# Patient Record
Sex: Female | Born: 1940 | Race: White | Hispanic: No | Marital: Married | Smoking: Never smoker
Health system: Southern US, Community
[De-identification: ages and names within clinical notes are randomized; demographics above are authoritative.]

## PROBLEM LIST (undated history)

## (undated) DIAGNOSIS — L439 Lichen planus, unspecified: Secondary | ICD-10-CM

## (undated) DIAGNOSIS — E785 Hyperlipidemia, unspecified: Secondary | ICD-10-CM

## (undated) DIAGNOSIS — K2970 Gastritis, unspecified, without bleeding: Secondary | ICD-10-CM

## (undated) DIAGNOSIS — F329 Major depressive disorder, single episode, unspecified: Secondary | ICD-10-CM

## (undated) DIAGNOSIS — R0789 Other chest pain: Secondary | ICD-10-CM

## (undated) DIAGNOSIS — I10 Essential (primary) hypertension: Secondary | ICD-10-CM

## (undated) DIAGNOSIS — M1990 Unspecified osteoarthritis, unspecified site: Secondary | ICD-10-CM

## (undated) DIAGNOSIS — Z862 Personal history of diseases of the blood and blood-forming organs and certain disorders involving the immune mechanism: Secondary | ICD-10-CM

## (undated) DIAGNOSIS — E119 Type 2 diabetes mellitus without complications: Secondary | ICD-10-CM

## (undated) DIAGNOSIS — F32A Depression, unspecified: Secondary | ICD-10-CM

## (undated) DIAGNOSIS — R0600 Dyspnea, unspecified: Secondary | ICD-10-CM

## (undated) DIAGNOSIS — R5383 Other fatigue: Secondary | ICD-10-CM

## (undated) DIAGNOSIS — Z8709 Personal history of other diseases of the respiratory system: Secondary | ICD-10-CM

## (undated) DIAGNOSIS — K589 Irritable bowel syndrome without diarrhea: Secondary | ICD-10-CM

## (undated) DIAGNOSIS — F419 Anxiety disorder, unspecified: Secondary | ICD-10-CM

## (undated) DIAGNOSIS — K5790 Diverticulosis of intestine, part unspecified, without perforation or abscess without bleeding: Secondary | ICD-10-CM

## (undated) HISTORY — DX: Depression, unspecified: F32.A

## (undated) HISTORY — DX: Personal history of diseases of the blood and blood-forming organs and certain disorders involving the immune mechanism: Z86.2

## (undated) HISTORY — PX: MOHS SURGERY: SHX181

## (undated) HISTORY — DX: Other chest pain: R07.89

## (undated) HISTORY — DX: Dyspnea, unspecified: R06.00

## (undated) HISTORY — DX: Essential (primary) hypertension: I10

## (undated) HISTORY — DX: Lichen planus, unspecified: L43.9

## (undated) HISTORY — DX: Other fatigue: R53.83

## (undated) HISTORY — DX: Diverticulosis of intestine, part unspecified, without perforation or abscess without bleeding: K57.90

## (undated) HISTORY — DX: Personal history of other diseases of the respiratory system: Z87.09

## (undated) HISTORY — DX: Irritable bowel syndrome, unspecified: K58.9

## (undated) HISTORY — DX: Unspecified osteoarthritis, unspecified site: M19.90

## (undated) HISTORY — DX: Gastritis, unspecified, without bleeding: K29.70

## (undated) HISTORY — DX: Hyperlipidemia, unspecified: E78.5

## (undated) HISTORY — PX: ABDOMINAL HYSTERECTOMY: SHX81

## (undated) HISTORY — PX: OTHER SURGICAL HISTORY: SHX169

## (undated) HISTORY — PX: APPENDECTOMY: SHX54

## (undated) HISTORY — PX: CATARACT EXTRACTION, BILATERAL: SHX1313

## (undated) HISTORY — PX: CHOLECYSTECTOMY: SHX55

---
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
## 2004-02-25 ENCOUNTER — Other Ambulatory Visit: Admission: RE | Admit: 2004-02-25 | Discharge: 2004-02-25 | Payer: Self-pay | Admitting: Obstetrics and Gynecology

## 2006-01-19 ENCOUNTER — Ambulatory Visit (HOSPITAL_BASED_OUTPATIENT_CLINIC_OR_DEPARTMENT_OTHER): Admission: RE | Admit: 2006-01-19 | Discharge: 2006-01-19 | Payer: Self-pay | Admitting: Orthopedic Surgery

## 2009-06-08 ENCOUNTER — Ambulatory Visit: Payer: Self-pay | Admitting: Cardiovascular Disease

## 2009-06-14 ENCOUNTER — Telehealth: Payer: Self-pay | Admitting: Cardiovascular Disease

## 2009-06-22 ENCOUNTER — Ambulatory Visit: Payer: Self-pay | Admitting: Cardiovascular Disease

## 2009-08-30 ENCOUNTER — Encounter: Admission: RE | Admit: 2009-08-30 | Discharge: 2009-08-30 | Payer: Self-pay | Admitting: Gastroenterology

## 2010-11-05 ENCOUNTER — Encounter: Payer: Self-pay | Admitting: Cardiovascular Disease

## 2010-12-06 NOTE — Assessment & Plan Note (Signed)
[facility]                        [place] CARDIOLOGY OFFICE NOTE   Brittney, [name]                      MRN:          [id number]  DATE:06/22/2009                            DOB:          Oct 04, 1940    PROBLEM LIST:  1. Hypertension.  2. Hyperlipidemia.  3. Recent onset of chest discomfort and increased fatigue and dyspnea.   INTERVAL HISTORY:  At the patient's initial visit, we had recommend a  left heart catheterization for the symptoms she was experiencing;  however, she declined that invasive approach.  A nuclear stress test  since that time showed some attenuation in the inferior wall and  anterior wall that was likely due to breast attenuation artifact and  diaphragmatic attenuation.  Wall motion was normal and there were  thought to be no perfusion defects or stress-induced ischemia.  The  patient states that since that time she has continued to have a  nocturnal chest discomfort radiating to her left arm.  She now states  that it is occasionally also radiates down her spine.  There are no  associated symptoms and it only occurs at night.  Sometimes she will  fall asleep at around 2:00 a.m. and it is still present the next  morning.  It is occurring approximately 2 times per week.  She also  endorses increased dyspnea only with exertion and increased fatigue.  She continues to decline an invasive approach and in meanwhile we can  seen an invasive diagnostic approach.   PHYSICAL EXAMINATION:  VITAL SIGNS:  Her blood pressure is 126/75, pulse  is 89, saturating 95% on room air.  She weighs 163 pounds.  GENERAL:  She is in no acute distress.  HEENT:  Nonfocal.  HEART:  Regular rate and rhythm without murmur, rub or gallop.  LUNGS:  Clear bilaterally.  ABDOMEN:  Soft, nontender, nondistended.  She does have some minor  tenderness to palpation along the left lower rib cage area.  EXTREMITIES:  Trace bilateral lower extremity edema.   ASSESSMENT AND PLAN:  The patient continues to be having symptoms that  are worrisome for her angina.  Her stress test is not a high-risk study  so medical approach to treatment is reasonable at this time.  She has  been taking her aspirin and Coreg intermittently and we asked that she  began to take them regularly.  Specifically, she should take aspirin  daily and Coreg 6.25 mg twice a day.  We also asked that she takes a  nitroglycerin she has at home while she is having the chest discomfort  to see if it helps relieve the symptoms.  She will contact our office in  the next week or two if the symptoms are not relieved with this  approach.  One option if she  continues to decline a heart catheterization is to perform a CT  angiogram of her coronary arteries.     Brayton El, MD  Electronically Signed    SGA/MedQ  DD: 06/22/2009  DT: 06/23/2009  Job #: [id number]   cc:   Dr. Brent Bulla

## 2010-12-06 NOTE — Assessment & Plan Note (Signed)
[facility]                        [place] CARDIOLOGY OFFICE NOTE   Brittney, [name]                      MRN:          [id number]  DATE:06/08/2009                            DOB:          05-Jan-1941    CHIEF COMPLAINT:  Chest pain.   [name] is a 70 year old white female with past medical history  significant for hyperlipidemia and a family history of premature  coronary disease, who is presenting with new chest discomfort for the  past 2 months.  The patient states for the past two months she has had  intermittent episodes of chest discomfort that consistently radiates to  her left arm.  It is not associated with activity and does not have any  associated symptoms.  It occurs several times a week, but seems to be  worse at night.  Each episode lasts an hour or two before resolving on  its own.  Of note, she was recently placed on Coreg by her primary care  physician and she states that the episodes are less intense since she  was given this medication.  The patient also endorses increased dyspnea  on exertion over the past several months.   PAST MEDICAL HISTORY:  1. Hyperlipidemia.  2. Borderline blood pressure per the patient.  3. She is status post cholecystectomy.  4. Status post hysterectomy.  5. History of colon polyps.  6. Diverticulosis.   SOCIAL HISTORY:  No tobacco, no alcohol.  She is retired and lives with  her husband.   FAMILY HISTORY:  Positive for premature coronary disease in most of her  brothers and sisters.   ALLERGIES:  THE PATIENT STATES MORPHINE AND CODEINE CAUSES NAUSEA.   MEDICATIONS:  1. Aspirin 81 mg daily.  2. Carvedilol 6.25 mg daily.  This may be the extended-release form.  3. Lovastatin 20 mg daily.  4. Levothyroxine 75 mcg daily.  5. Multivitamin.  6. Coenzyme Q10.  7. Biotin.  8. Vitamin D.  9. Calcium.  10.Magnesium.  11.Zinc.  12.B12.   REVIEW OF SYSTEMS:  As in HPI.  All other systems  were reviewed and are  negative.   PHYSICAL EXAMINATION:  VITAL SIGNS:  Blood pressure is 116/75, pulse 83.  Saturating 95% room air.  She weighs 164 pounds.  GENERAL:  She is in no acute distress.  HEENT:  Normocephalic, atraumatic.  Nonfocal.  NECK:  Supple.  There is no JVD.  There are no carotid bruits.  HEART:  Regular rate and rhythm without murmur, rub or gallop.  LUNGS:  Clear bilaterally.  ABDOMEN:  Soft, nontender, nondistended.  EXTREMITIES:  Without edema.  SKIN:  Warm and dry.  NEURO:  Nonfocal.  MUSCULOSKELETAL:  The patient has 5/5 bilateral upper and lower  extremity strength.  PSYCHIATRIC:  The patient is appropriate with normal levels of insight.   LABORATORY DATA:  Dated June 03, 2009, sodium 142, potassium 5.2,  chloride 104, CO2 of 21, BUN 11, creatinine 0.79, glucose 110, white  count 5.8, hemoglobin 14, hematocrit 42, platelet count 185.  LFTs are  within normal limits.  She had a total cholesterol  of 152, triglycerides  86, HDL 47, LDL 86, TSH is 2.98.  EKG taken today in the office  independently reviewed by myself demonstrates normal sinus rhythm  without any ST or T-wave abnormalities.   ASSESSMENT:  A 70 year old white female with hyperlipidemia and a family  history of premature coronary disease presenting with symptoms that are  concerning for angina.  The patient's symptoms have been slightly  improved with the addition of beta blockade to her medical regimen,  however, they are still occurring.   PLAN:  I recommended a left heart catheterization but the patient  declines the procedure at this time.  In it's place we have ordered a  nuclear stress test.  She should continue on the ASA and coreg and call  [phone] if the chest discomfort does not resolve or worsens.     Brayton El, MD  Electronically Signed    SGA/MedQ  DD: 06/08/2009  DT: 06/08/2009  Job #: [id number]

## 2010-12-06 NOTE — Letter (Signed)
June 08, 2009    Dr. Brent Bulla  [address]  [place], [place]  [address]   RE:  [name], [name]  MRN:  [id number]  /  DOB:  1940-08-01   Dear Dr. Marina Goodell:   I had the pleasure of seeing your patient, Ms. [name], in the Cardiology  Clinic today.  As you know, she has been experiencing chest discomfort  radiating to her left arm intermittently for the past 2 months.  These  symptoms combined with the fact that she has a family history of  premature coronary disease and a history of hyperlipidemia clearly  increase the potential for obstructive coronary artery disease.  I have  recommended a left heart catheterization for Ms. [name] to evaluate  this.  However at this time, she is declining the procedure secondary to  concerns regarding its invasive nature.  Instead, we have opted for a  [employer] which she will have.  If her stress test is  positive, I would again recommend left heart catheterization.  If her  stress test is negative, I would most likely observe her for a period of  time to see if the chest pain resolves.  If it does not resolve, I would  probably still recommend an invasive evaluation.  I will keep you  updated as the process unfolds.    Sincerely,      Brayton El, MD  Electronically Signed    SGA/MedQ  DD: 06/08/2009  DT: 06/08/2009  Job #: [id number]

## 2010-12-09 NOTE — Op Note (Signed)
NAMESOLANGE, [name]               ACCOUNT NO.:  [id number]   MEDICAL RECORD NO.:  [id number]          PATIENT TYPE:  AMB   LOCATION:  DSC                          FACILITY:  [facility]   PHYSICIAN:  Rodney A. Mortenson, M.D.DATE OF BIRTH:  May 03, 1941   DATE OF PROCEDURE:  01/19/2006  DATE OF DISCHARGE:                                 OPERATIVE REPORT   PREOPERATIVE DIAGNOSIS:  Tear of posterior horn of medial meniscus, left  knee.   POSTOPERATIVE DIAGNOSES:  Tear of posterior horn of medial meniscus, left  knee, fraying of leading edge of the lateral meniscus, advanced  chondromalacia of lateral patellar facet, left knee.   OPERATION:  Partial medial and lateral meniscectomy, chondroplasty of  lateral facet, left knee.   SURGEON:  Lenard Galloway. Chaney Malling, M.D.   ANESTHESIA:  MAC.   PATHOLOGY:  Using the arthroscope a very careful examination of the knee was  undertaken.  The patellofemoral joint was visualized first.  There was a  fair amount of fraying and tearing about the articular cartilage and lateral  patellar facet.  The femoral notch area, however, appeared fairly normal.  The anterior cruciate ligament was normal.  In the lateral compartment there  was normal articular cartilage of the lateral femoral condyle and lateral  tibial plateau, but there was fraying of the leading edge of the lateral  meniscus.  The anterior cruciate ligament was normal.  In the medial  compartment there was a complex tear of the posterior horn of the medial  meniscus and there was some damage to the weightbearing area of the medial  femoral condyle articular cartilage.   DESCRIPTION OF PROCEDURE:  The patient was placed on the operating room  table in the supine position. With the pneumatic tourniquet about the left  upper thigh, the entire left lower extremity was prepped with DuraPrep and  draped out in the usual manner.  Marcaine was placed in the knee and  Xylocaine and epinephrine used to  infiltrate the puncture wounds.  An  infusion cannula was placed in the paramedial pouch and the knee distended  with saline.  The anterior medial and anterior lateral portals were made and  the arthroscope was introduced.  Attention was first turned to the patella.  The lateral patellar facet was debrided with a full radius resector.  This  was debrided down to fairly smooth and stable cartilage.  This cleaned up  very nicely.  The arthroscope was then plated in the lateral compartment.  There was fraying of the leading edge of the lateral meniscus and this area  was debrided with a chondroplasty shaver.  Attention was then turned to the  medial compartment.  There was a complicated tear of the posterior horn of  the medial meniscus and this was debrided through the medial and lateral  portals and a midline portal.  Once this was completed, an intra-articular  shaver was introduced.  All debris was removed.  The remaining rim was then  smoothed and balanced with a nice transition of the medial meniscus.  No  other significant pathology was seen in  the knee.  Marcaine was placed in  the knee and a large bulky pressure dressing was applied.   The patient returned to the recovery room in excellent condition.  Technically this procedure went extremely well.   PLAN:  1.  Regular follow-up care.  To be seen in my office next week.  2.  Macrodantin Fortis for pain.  3.  Home instructions given to the family.           ______________________________  Lenard Galloway Chaney Malling, M.D.     RAM/MEDQ  D:  01/19/2006  T:  01/19/2006  Job:  [id number]

## 2011-02-03 ENCOUNTER — Encounter: Payer: Self-pay | Admitting: Cardiovascular Disease

## 2011-05-10 HISTORY — PX: ESOPHAGOGASTRODUODENOSCOPY: SHX1529

## 2014-10-15 ENCOUNTER — Other Ambulatory Visit: Payer: Self-pay | Admitting: Gastroenterology

## 2014-10-15 DIAGNOSIS — Z1211 Encounter for screening for malignant neoplasm of colon: Secondary | ICD-10-CM

## 2014-10-29 ENCOUNTER — Ambulatory Visit
Admission: RE | Admit: 2014-10-29 | Discharge: 2014-10-29 | Disposition: A | Discharge status: Home / Self Care | Payer: Medicare Other | Source: Ambulatory Visit | Attending: Gastroenterology | Admitting: Gastroenterology

## 2014-10-29 DIAGNOSIS — Z1211 Encounter for screening for malignant neoplasm of colon: Secondary | ICD-10-CM

## 2014-10-29 HISTORY — PX: OTHER SURGICAL HISTORY: SHX169

## 2016-07-07 DIAGNOSIS — Z6827 Body mass index (BMI) 27.0-27.9, adult: Secondary | ICD-10-CM | POA: Diagnosis not present

## 2016-07-07 DIAGNOSIS — J019 Acute sinusitis, unspecified: Secondary | ICD-10-CM | POA: Diagnosis not present

## 2016-07-11 DIAGNOSIS — E039 Hypothyroidism, unspecified: Secondary | ICD-10-CM | POA: Diagnosis not present

## 2016-07-11 DIAGNOSIS — E559 Vitamin D deficiency, unspecified: Secondary | ICD-10-CM | POA: Diagnosis not present

## 2016-07-11 DIAGNOSIS — R5382 Chronic fatigue, unspecified: Secondary | ICD-10-CM | POA: Diagnosis not present

## 2016-07-13 DIAGNOSIS — L430 Hypertrophic lichen planus: Secondary | ICD-10-CM | POA: Diagnosis not present

## 2016-07-13 DIAGNOSIS — C44311 Basal cell carcinoma of skin of nose: Secondary | ICD-10-CM | POA: Diagnosis not present

## 2016-07-13 DIAGNOSIS — L603 Nail dystrophy: Secondary | ICD-10-CM | POA: Diagnosis not present

## 2016-07-13 DIAGNOSIS — D485 Neoplasm of uncertain behavior of skin: Secondary | ICD-10-CM | POA: Diagnosis not present

## 2016-07-20 DIAGNOSIS — L439 Lichen planus, unspecified: Secondary | ICD-10-CM | POA: Diagnosis not present

## 2016-07-20 DIAGNOSIS — E119 Type 2 diabetes mellitus without complications: Secondary | ICD-10-CM | POA: Diagnosis not present

## 2016-07-20 DIAGNOSIS — R5383 Other fatigue: Secondary | ICD-10-CM | POA: Diagnosis not present

## 2016-07-20 DIAGNOSIS — E039 Hypothyroidism, unspecified: Secondary | ICD-10-CM | POA: Diagnosis not present

## 2016-07-20 DIAGNOSIS — E785 Hyperlipidemia, unspecified: Secondary | ICD-10-CM | POA: Diagnosis not present

## 2016-07-20 DIAGNOSIS — K219 Gastro-esophageal reflux disease without esophagitis: Secondary | ICD-10-CM | POA: Diagnosis not present

## 2016-07-20 DIAGNOSIS — R42 Dizziness and giddiness: Secondary | ICD-10-CM | POA: Diagnosis not present

## 2016-07-20 DIAGNOSIS — Z6827 Body mass index (BMI) 27.0-27.9, adult: Secondary | ICD-10-CM | POA: Diagnosis not present

## 2016-08-08 DIAGNOSIS — R928 Other abnormal and inconclusive findings on diagnostic imaging of breast: Secondary | ICD-10-CM | POA: Diagnosis not present

## 2016-08-08 DIAGNOSIS — R921 Mammographic calcification found on diagnostic imaging of breast: Secondary | ICD-10-CM | POA: Diagnosis not present

## 2016-08-08 DIAGNOSIS — Z09 Encounter for follow-up examination after completed treatment for conditions other than malignant neoplasm: Secondary | ICD-10-CM | POA: Diagnosis not present

## 2016-09-20 DIAGNOSIS — C44319 Basal cell carcinoma of skin of other parts of face: Secondary | ICD-10-CM | POA: Diagnosis not present

## 2016-10-13 DIAGNOSIS — Z961 Presence of intraocular lens: Secondary | ICD-10-CM | POA: Diagnosis not present

## 2016-10-13 DIAGNOSIS — H524 Presbyopia: Secondary | ICD-10-CM | POA: Diagnosis not present

## 2016-10-13 DIAGNOSIS — H04123 Dry eye syndrome of bilateral lacrimal glands: Secondary | ICD-10-CM | POA: Diagnosis not present

## 2016-10-13 DIAGNOSIS — H31002 Unspecified chorioretinal scars, left eye: Secondary | ICD-10-CM | POA: Diagnosis not present

## 2016-12-05 DIAGNOSIS — R42 Dizziness and giddiness: Secondary | ICD-10-CM | POA: Diagnosis not present

## 2016-12-05 DIAGNOSIS — E039 Hypothyroidism, unspecified: Secondary | ICD-10-CM | POA: Diagnosis not present

## 2016-12-05 DIAGNOSIS — R5383 Other fatigue: Secondary | ICD-10-CM | POA: Diagnosis not present

## 2016-12-05 DIAGNOSIS — K219 Gastro-esophageal reflux disease without esophagitis: Secondary | ICD-10-CM | POA: Diagnosis not present

## 2016-12-27 DIAGNOSIS — G7000 Myasthenia gravis without (acute) exacerbation: Secondary | ICD-10-CM | POA: Diagnosis not present

## 2016-12-27 DIAGNOSIS — R682 Dry mouth, unspecified: Secondary | ICD-10-CM | POA: Diagnosis not present

## 2016-12-27 DIAGNOSIS — R0609 Other forms of dyspnea: Secondary | ICD-10-CM | POA: Diagnosis not present

## 2016-12-27 DIAGNOSIS — M6281 Muscle weakness (generalized): Secondary | ICD-10-CM | POA: Diagnosis not present

## 2016-12-27 DIAGNOSIS — R5383 Other fatigue: Secondary | ICD-10-CM | POA: Diagnosis not present

## 2017-04-04 DIAGNOSIS — R5383 Other fatigue: Secondary | ICD-10-CM | POA: Diagnosis not present

## 2017-04-04 DIAGNOSIS — E119 Type 2 diabetes mellitus without complications: Secondary | ICD-10-CM | POA: Diagnosis not present

## 2017-04-04 DIAGNOSIS — E039 Hypothyroidism, unspecified: Secondary | ICD-10-CM | POA: Diagnosis not present

## 2017-04-04 DIAGNOSIS — R42 Dizziness and giddiness: Secondary | ICD-10-CM | POA: Diagnosis not present

## 2017-04-04 DIAGNOSIS — E559 Vitamin D deficiency, unspecified: Secondary | ICD-10-CM | POA: Diagnosis not present

## 2017-04-04 DIAGNOSIS — K219 Gastro-esophageal reflux disease without esophagitis: Secondary | ICD-10-CM | POA: Diagnosis not present

## 2017-04-04 DIAGNOSIS — E538 Deficiency of other specified B group vitamins: Secondary | ICD-10-CM | POA: Diagnosis not present

## 2017-06-28 DIAGNOSIS — J209 Acute bronchitis, unspecified: Secondary | ICD-10-CM | POA: Diagnosis not present

## 2017-08-15 DIAGNOSIS — K219 Gastro-esophageal reflux disease without esophagitis: Secondary | ICD-10-CM | POA: Diagnosis not present

## 2017-08-15 DIAGNOSIS — E119 Type 2 diabetes mellitus without complications: Secondary | ICD-10-CM | POA: Diagnosis not present

## 2017-08-15 DIAGNOSIS — E785 Hyperlipidemia, unspecified: Secondary | ICD-10-CM | POA: Diagnosis not present

## 2017-08-15 DIAGNOSIS — E039 Hypothyroidism, unspecified: Secondary | ICD-10-CM | POA: Diagnosis not present

## 2017-09-13 DIAGNOSIS — J209 Acute bronchitis, unspecified: Secondary | ICD-10-CM | POA: Diagnosis not present

## 2017-09-13 DIAGNOSIS — J019 Acute sinusitis, unspecified: Secondary | ICD-10-CM | POA: Diagnosis not present

## 2017-10-18 DIAGNOSIS — H31002 Unspecified chorioretinal scars, left eye: Secondary | ICD-10-CM | POA: Diagnosis not present

## 2017-10-18 DIAGNOSIS — H04123 Dry eye syndrome of bilateral lacrimal glands: Secondary | ICD-10-CM | POA: Diagnosis not present

## 2017-10-18 DIAGNOSIS — H52203 Unspecified astigmatism, bilateral: Secondary | ICD-10-CM | POA: Diagnosis not present

## 2017-10-18 DIAGNOSIS — H43813 Vitreous degeneration, bilateral: Secondary | ICD-10-CM | POA: Diagnosis not present

## 2017-12-24 DIAGNOSIS — L439 Lichen planus, unspecified: Secondary | ICD-10-CM | POA: Diagnosis not present

## 2017-12-24 DIAGNOSIS — E559 Vitamin D deficiency, unspecified: Secondary | ICD-10-CM | POA: Diagnosis not present

## 2017-12-24 DIAGNOSIS — E785 Hyperlipidemia, unspecified: Secondary | ICD-10-CM | POA: Diagnosis not present

## 2017-12-24 DIAGNOSIS — E119 Type 2 diabetes mellitus without complications: Secondary | ICD-10-CM | POA: Diagnosis not present

## 2018-01-17 DIAGNOSIS — R921 Mammographic calcification found on diagnostic imaging of breast: Secondary | ICD-10-CM | POA: Diagnosis not present

## 2018-02-22 ENCOUNTER — Encounter: Payer: Self-pay | Admitting: Physician Assistant

## 2018-02-22 ENCOUNTER — Encounter (INDEPENDENT_AMBULATORY_CARE_PROVIDER_SITE_OTHER): Payer: Self-pay

## 2018-02-22 ENCOUNTER — Other Ambulatory Visit (INDEPENDENT_AMBULATORY_CARE_PROVIDER_SITE_OTHER): Payer: Medicare Other

## 2018-02-22 ENCOUNTER — Ambulatory Visit (INDEPENDENT_AMBULATORY_CARE_PROVIDER_SITE_OTHER): Payer: Medicare Other | Admitting: Physician Assistant

## 2018-02-22 VITALS — BP 114/68 | HR 74 | Ht 64.0 in | Wt 157.4 lb

## 2018-02-22 DIAGNOSIS — R1084 Generalized abdominal pain: Secondary | ICD-10-CM

## 2018-02-22 DIAGNOSIS — R110 Nausea: Secondary | ICD-10-CM | POA: Diagnosis not present

## 2018-02-22 DIAGNOSIS — R194 Change in bowel habit: Secondary | ICD-10-CM

## 2018-02-22 DIAGNOSIS — R935 Abnormal findings on diagnostic imaging of other abdominal regions, including retroperitoneum: Secondary | ICD-10-CM

## 2018-02-22 LAB — COMPREHENSIVE METABOLIC PANEL
ALT: 9 U/L (ref 0–35)
AST: 13 U/L (ref 0–37)
Albumin: 4.3 g/dL (ref 3.5–5.2)
Alkaline Phosphatase: 62 U/L (ref 39–117)
BUN: 7 mg/dL (ref 6–23)
CO2: 30 mEq/L (ref 19–32)
Calcium: 9.2 mg/dL (ref 8.4–10.5)
Chloride: 103 mEq/L (ref 96–112)
Creatinine, Ser: 0.69 mg/dL (ref 0.40–1.20)
GFR: 87.53 mL/min (ref >60.00)
Glucose, Bld: 119 mg/dL — ABNORMAL HIGH (ref 70–99)
Potassium: 3.9 mEq/L (ref 3.5–5.1)
Sodium: 140 mEq/L (ref 135–145)
Total Bilirubin: 0.6 mg/dL (ref 0.2–1.2)
Total Protein: 6.7 g/dL (ref 6.0–8.3)

## 2018-02-22 LAB — CBC WITH DIFFERENTIAL/PLATELET
Basophils Absolute: 0.1 10*3/uL (ref 0.0–0.1)
Basophils Relative: 1.2 % (ref 0.0–3.0)
Eosinophils Absolute: 0.1 10*3/uL (ref 0.0–0.7)
Eosinophils Relative: 1 % (ref 0.0–5.0)
HCT: 40.2 % (ref 36.0–46.0)
Hemoglobin: 13.5 g/dL (ref 12.0–15.0)
Lymphocytes Relative: 25.1 % (ref 12.0–46.0)
Lymphs Abs: 1.6 10*3/uL (ref 0.7–4.0)
MCHC: 33.6 g/dL (ref 30.0–36.0)
MCV: 84.3 fl (ref 78.0–100.0)
Monocytes Absolute: 0.5 10*3/uL (ref 0.1–1.0)
Monocytes Relative: 8.2 % (ref 3.0–12.0)
Neutro Abs: 4.2 10*3/uL (ref 1.4–7.7)
Neutrophils Relative %: 64.5 % (ref 43.0–77.0)
Platelets: 179 10*3/uL (ref 150.0–400.0)
RBC: 4.77 Mil/uL (ref 3.87–5.11)
RDW: 14.3 % (ref 11.5–15.5)
WBC: 6.5 10*3/uL (ref 4.0–10.5)

## 2018-02-22 LAB — LIPASE: Lipase: 15 U/L (ref 11.0–59.0)

## 2018-02-22 MED ORDER — PANTOPRAZOLE SODIUM 40 MG PO TBEC: 40.0000 mg | DELAYED_RELEASE_TABLET | Freq: Every day | ORAL | 3 refills | Status: DC

## 2018-02-22 NOTE — Patient Instructions (Signed)
We have sent the following medications to your pharmacy for you to pick up at your convenience: Pantoprazole 40 mg daily  Your provider has requested that you go to the basement level for lab work before leaving today. Press "B" on the elevator. The lab is located at the first door on the left as you exit the elevator.  You have been scheduled for a CT scan of the abdomen and pelvis at [facility] ([address]---this is in the same building as [facility]).   You are scheduled on 03-01-18 at 2:30 pm. You should arrive 15 minutes prior to your appointment time for registration. Please follow the written instructions below on the day of your exam:  WARNING: IF YOU ARE ALLERGIC TO IODINE/X-RAY DYE, PLEASE NOTIFY RADIOLOGY IMMEDIATELY AT [phone]! YOU WILL BE GIVEN A 13 HOUR PREMEDICATION PREP.  1) Do not eat or drink anything after 10:30 am (4 hours prior to your test) 2) You have been given 2 bottles of oral contrast to drink. The solution may taste better if refrigerated, but do NOT add ice or any other liquid to this solution. Shake well before drinking.    Drink 1 bottle of contrast @ 12:30 pm (2 hours prior to your exam)  Drink 1 bottle of contrast @ 1:30 pm (1 hour prior to your exam)  You may take any medications as prescribed with a small amount of water except for the following: Metformin, Glucophage, Glucovance, Avandamet, Riomet, Fortamet, Actoplus Met, Janumet, Glumetza or Metaglip. The above medications must be held the day of the exam AND 48 hours after the exam.  The purpose of you drinking the oral contrast is to aid in the visualization of your intestinal tract. The contrast solution may cause some diarrhea. Before your exam is started, you will be given a small amount of fluid to drink. Depending on your individual set of symptoms, you may also receive an intravenous injection of x-ray contrast/dye. Plan on being at [facility] for 30 minutes or  longer, depending on the type of exam you are having performed.  This test typically takes 30-45 minutes to complete.  If you have any questions regarding your exam or if you need to reschedule, you may call the CT department at [phone] between the hours of 8:00 am and 5:00 pm, Monday-Friday.  ________________________________________________________________________

## 2018-02-22 NOTE — Progress Notes (Signed)
Chief Complaint: Abdominal pain, nausea  HPI:    [name] is a 77 year old female, known to Dr. Chales Abrahams, with a past medical history as listed below, previous cholecystectomy, who presents to clinic today as referral from [place], NP, with a complaint of abdominal pain and nausea.    EGD 05/10/2011 for epigastric pain and GERD with moderate gastritis.  Treated for H. pylori.  Patient was continued on Dexilant 60 mg p.o. once daily.    04/25/2012 MRI for abnormal pancreas on CT and mid abdominal pain.  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  Consistent with a benign lesion and no further work-up was required.    10/13/2014 office visit to discuss constipation.  Also scheduled for virtual colonoscopy at that visit due to several attempts at colonoscopy which had been unsuccessful.  Patient was to changed to Colace daily and set up for MRI of the pancreas due to recent abnormal CT as well as virtual colonoscopy.  Amitiza and Linzess were discussed if the patient continued with constipation.    Virtual screening colonoscopy 10/29/2014 with multiple colonic diverticula throughout the colon, primarily concentrated in the rectosigmoid and descending colon, some circumferential mucosal thickening of the rectum and a tortuous and elongated colon.  It was thought possibly sigmoidoscopy would be used to better visualize the rectum.      Today, presents to clinic accompanied by her husband who does assist with history.  Apparently 3 weeks ago started with some generalized abdominal discomfort all around the center of her abdomen and at that time was experiencing constipation.  She tried to use 3 different types of laxatives which were not working and was chewing on Tums due to an increase in nausea around that time, she finally had something "break loose" and proceeded to have little pieces of stool and has gradually been getting back to normal  bowel movements but it seems that her abdominal pain is worse.  At one point was also in her epigastrium and radiated through to her back and now complains more in her right lower quadrant towards her hip.  Tells me the last couple of days all this has seemed to let up but she has been nauseous.  Associated symptoms include bloating "it looks like I was ready to deliver twins".  Also excessive gas and a fatigue.  Does admit to eating a lot of fresh tomatoes recently.    Denies fever, chills, weight loss, blood in her stools or symptoms that awaken her from sleep.  Past Medical History:  Diagnosis Date  . Chest discomfort   . Diverticulosis   . Dyspnea   . Fatigue   . HLD (hyperlipidemia)   . HTN (hypertension)     Past Surgical History:  Procedure Laterality Date  . CHOLECYSTECTOMY    . hysterectomy - unknown type      Current Outpatient Medications  Medication Sig Dispense Refill  . aspirin 81 MG tablet Take 81 mg by mouth daily.      . Biotin 10 MG TABS Take by mouth every other day.      . calcium carbonate 200 MG capsule Take 250 mg by mouth every other day.      . carvedilol (COREG) 6.25 MG tablet Take 6.25 mg by mouth 2 (two) times daily with a meal.      . Cholecalciferol (VITAMIN D3) 3000 UNITS TABS Take by mouth every other day.      Marland Kitchen  Coenzyme Q10 (CO Q 10 PO) Take by mouth every other day.      . cyanocobalamin 500 MCG tablet Take 500 mcg by mouth every other day.      . levothyroxine (SYNTHROID, LEVOTHROID) 75 MCG tablet Take 75 mcg by mouth daily.      Marland Kitchen loratadine (CLARITIN) 10 MG tablet Take 10 mg by mouth daily.      Marland Kitchen lovastatin (MEVACOR) 20 MG tablet Take 20 mg by mouth at bedtime.      . magnesium oxide (MAG-OX) 400 MG tablet Take 400 mg by mouth every other day.      . Multiple Vitamin (MULTIVITAMIN) tablet Take 1 tablet by mouth daily.      Marland Kitchen zinc gluconate 50 MG tablet Take 50 mg by mouth every other day.       No current facility-administered medications for  this visit.     Allergies as of 02/22/2018 - Review Complete 02/22/2018  Allergen Reaction Noted  . Codeine  11/05/2010  . Morphine and related  11/05/2010    Family History  Problem Relation Age of Onset  . Coronary artery disease Unknown     Social History   Socioeconomic History  . Marital status: Married    Spouse name: Not on file  . Number of children: 3  . Years of education: Not on file  . Highest education level: Not on file  Occupational History  . Occupation: retired  Engineer, production  . Financial resource strain: Not on file  . Food insecurity:    Worry: Not on file    Inability: Not on file  . Transportation needs:    Medical: Not on file    Non-medical: Not on file  Tobacco Use  . Smoking status: Never Smoker  . Smokeless tobacco: Never Used  Substance and Sexual Activity  . Alcohol use: No  . Drug use: No  . Sexual activity: Not on file  Lifestyle  . Physical activity:    Days per week: Not on file    Minutes per session: Not on file  . Stress: Not on file  Relationships  . Social connections:    Talks on phone: Not on file    Gets together: Not on file    Attends religious service: Not on file    Active member of club or organization: Not on file    Attends meetings of clubs or organizations: Not on file    Relationship status: Not on file  . Intimate partner violence:    Fear of current or ex partner: Not on file    Emotionally abused: Not on file    Physically abused: Not on file    Forced sexual activity: Not on file  Other Topics Concern  . Not on file  Social History Narrative  . Not on file    Review of Systems:    Constitutional: No weight loss, fever or chills Skin: No rash  Cardiovascular: No chest pain Respiratory: No SOB Gastrointestinal: See HPI and otherwise negative Genitourinary: No dysuria  Neurological: No headache, dizziness or syncope Musculoskeletal: No new muscle or joint pain Hematologic: No bleeding    Psychiatric: No history of depression or anxiety   Physical Exam:  Vital signs: BP 114/68   Pulse 74   Ht 5\' 4"  (1.626 m)   Wt 157 lb 6 oz (71.4 kg)   BMI 27.01 kg/m    Constitutional:   Pleasant Elderly Caucasian female appears to be in NAD, Well  developed, Well nourished, alert and cooperative Head:  Normocephalic and atraumatic. Eyes:   PEERL, EOMI. No icterus. Conjunctiva pink. Ears:  Normal auditory acuity. Neck:  Supple Throat: Oral cavity and pharynx without inflammation, swelling or lesion.  Respiratory: Respirations even and unlabored. Lungs clear to auscultation bilaterally.   No wheezes, crackles, or rhonchi.  Cardiovascular: Normal S1, S2. No MRG. Regular rate and rhythm. No peripheral edema, cyanosis or pallor.  Gastrointestinal:  Soft, nondistended, mild generalized ttp worse in RLQ, No rebound or guarding. Normal bowel sounds. No appreciable masses or hepatomegaly. Rectal:  Not performed.  Msk:  Symmetrical without gross deformities. Without edema, no deformity or joint abnormality.  Neurologic:  Alert and  oriented x4;  grossly normal neurologically.  Skin:   Dry and intact without significant lesions or rashes. Psychiatric: Demonstrates good judgement and reason without abnormal affect or behaviors.  No recent labs or imaging.  Assessment: 1.  Abdominal pain: Generalized, seems to move around her abdomen over the past 3 weeks, now in her right lower quadrant, history of constipation per Dr. Urban GibsonGupta's notes; consider constipation +/- gastritis (seen on EGD in the past) versus other 2.  Nausea: With above 3.  Change in bowel habits: Worsened constipation, looking back it appears patient has always had trouble with constipation 4.  Abnormal imaging of the pancreas: Originally on CT, follow-up MRI in 2013 showed this with a cyst and there was no further follow-up recommended  Plan: 1.  Scheduled patient for a CT of the abdomen pelvis with contrast 2.  Ordered CBC, CMP  and lipase 3.  Start patient on Pantoprazole 40 mg daily, 30-60 minutes before breakfast #30 with 3 refills 4.  Did tell the patient we will request the rest of her records from Dr. Urban GibsonGupta's previous office.  Did receive those and they are in HPI.  Will have my nurse call her and let her know. 5.  Patient to follow in clinic per recommendations after imaging and labs above.  Hyacinth MeekerJennifer Lemmon, PA-C [facility] 02/22/2018, 1:31 PM

## 2018-03-01 ENCOUNTER — Ambulatory Visit (INDEPENDENT_AMBULATORY_CARE_PROVIDER_SITE_OTHER)
Admission: RE | Admit: 2018-03-01 | Discharge: 2018-03-01 | Disposition: A | Discharge status: Home / Self Care | Payer: Medicare Other | Source: Ambulatory Visit | Attending: Physician Assistant | Admitting: Physician Assistant

## 2018-03-01 DIAGNOSIS — R194 Change in bowel habit: Secondary | ICD-10-CM | POA: Diagnosis not present

## 2018-03-01 DIAGNOSIS — K5730 Diverticulosis of large intestine without perforation or abscess without bleeding: Secondary | ICD-10-CM | POA: Diagnosis not present

## 2018-03-01 MED ORDER — IOPAMIDOL (ISOVUE-300) INJECTION 61%
100.0000 mL | Freq: Once | INTRAVENOUS | Status: AC | PRN
  Administered 2018-03-01: 100 mL via INTRAVENOUS

## 2018-03-05 ENCOUNTER — Other Ambulatory Visit: Payer: Self-pay

## 2018-03-05 DIAGNOSIS — K5790 Diverticulosis of intestine, part unspecified, without perforation or abscess without bleeding: Secondary | ICD-10-CM

## 2018-03-05 DIAGNOSIS — R935 Abnormal findings on diagnostic imaging of other abdominal regions, including retroperitoneum: Secondary | ICD-10-CM

## 2018-03-05 DIAGNOSIS — R1084 Generalized abdominal pain: Secondary | ICD-10-CM

## 2018-03-18 ENCOUNTER — Ambulatory Visit (HOSPITAL_COMMUNITY)
Admission: RE | Admit: 2018-03-18 | Discharge: 2018-03-18 | Disposition: A | Discharge status: Home / Self Care | Payer: Medicare Other | Source: Ambulatory Visit | Attending: Physician Assistant | Admitting: Physician Assistant

## 2018-03-18 DIAGNOSIS — R109 Unspecified abdominal pain: Secondary | ICD-10-CM | POA: Diagnosis not present

## 2018-03-18 DIAGNOSIS — R935 Abnormal findings on diagnostic imaging of other abdominal regions, including retroperitoneum: Secondary | ICD-10-CM | POA: Insufficient documentation

## 2018-03-18 DIAGNOSIS — R1084 Generalized abdominal pain: Secondary | ICD-10-CM | POA: Insufficient documentation

## 2018-03-18 DIAGNOSIS — K5790 Diverticulosis of intestine, part unspecified, without perforation or abscess without bleeding: Secondary | ICD-10-CM | POA: Insufficient documentation

## 2018-04-24 DIAGNOSIS — L439 Lichen planus, unspecified: Secondary | ICD-10-CM | POA: Diagnosis not present

## 2018-04-24 DIAGNOSIS — E559 Vitamin D deficiency, unspecified: Secondary | ICD-10-CM | POA: Diagnosis not present

## 2018-04-24 DIAGNOSIS — E785 Hyperlipidemia, unspecified: Secondary | ICD-10-CM | POA: Diagnosis not present

## 2018-04-24 DIAGNOSIS — E119 Type 2 diabetes mellitus without complications: Secondary | ICD-10-CM | POA: Diagnosis not present

## 2018-04-24 DIAGNOSIS — E039 Hypothyroidism, unspecified: Secondary | ICD-10-CM | POA: Diagnosis not present

## 2018-06-15 DIAGNOSIS — J029 Acute pharyngitis, unspecified: Secondary | ICD-10-CM | POA: Diagnosis not present

## 2018-10-10 ENCOUNTER — Other Ambulatory Visit: Payer: Self-pay

## 2018-10-10 ENCOUNTER — Telehealth: Payer: Self-pay

## 2018-10-10 MED ORDER — PANTOPRAZOLE SODIUM 40 MG PO TBEC: 40.0000 mg | DELAYED_RELEASE_TABLET | Freq: Every day | ORAL | 3 refills | Status: DC

## 2018-10-10 NOTE — Telephone Encounter (Signed)
Received refill request for Pantoprazole  40 mg.  Sent electronically to [facility].  #30 with 3 refills.

## 2018-12-18 DIAGNOSIS — E559 Vitamin D deficiency, unspecified: Secondary | ICD-10-CM | POA: Diagnosis not present

## 2018-12-18 DIAGNOSIS — E119 Type 2 diabetes mellitus without complications: Secondary | ICD-10-CM | POA: Diagnosis not present

## 2018-12-18 DIAGNOSIS — E785 Hyperlipidemia, unspecified: Secondary | ICD-10-CM | POA: Diagnosis not present

## 2018-12-18 DIAGNOSIS — L439 Lichen planus, unspecified: Secondary | ICD-10-CM | POA: Diagnosis not present

## 2019-03-19 DIAGNOSIS — L438 Other lichen planus: Secondary | ICD-10-CM | POA: Diagnosis not present

## 2019-06-04 DIAGNOSIS — D485 Neoplasm of uncertain behavior of skin: Secondary | ICD-10-CM | POA: Diagnosis not present

## 2019-06-04 DIAGNOSIS — Z85828 Personal history of other malignant neoplasm of skin: Secondary | ICD-10-CM | POA: Diagnosis not present

## 2019-06-04 DIAGNOSIS — L438 Other lichen planus: Secondary | ICD-10-CM | POA: Diagnosis not present

## 2019-06-04 DIAGNOSIS — L989 Disorder of the skin and subcutaneous tissue, unspecified: Secondary | ICD-10-CM | POA: Diagnosis not present

## 2019-09-30 IMAGING — CT CT ABD-PELV W/ CM
2 of 5 series · 14 of 46 positions shown, 16 images · IV contrast (ISOVUE 300)
Comparison: 10/29/2014; 04/11/2011; 08/30/2009

CLINICAL DATA: Constipation. Right mid and lower quadrant abdominal
pain. Nausea.

EXAM:
CT ABDOMEN AND PELVIS WITH CONTRAST
TECHNIQUE: Multidetector CT imaging of the abdomen and pelvis was performed
using the standard protocol following bolus administration of
intravenous contrast.
CONTRAST:  100mL U9XABS-Y22 IOPAMIDOL (U9XABS-Y22) INJECTION 61%

[Series 2: abd/pel w · axial · 0.76mm/px · z∈[-457,-32]mm · 11 of 97 slices shown, 13 images]
[im 6/97  soft-tissue]
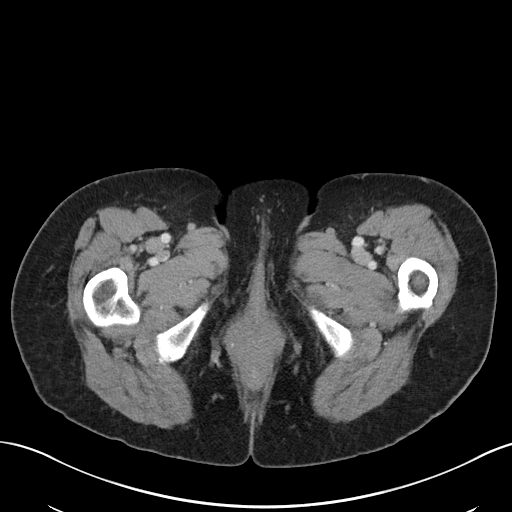
[im 6/97  bone]
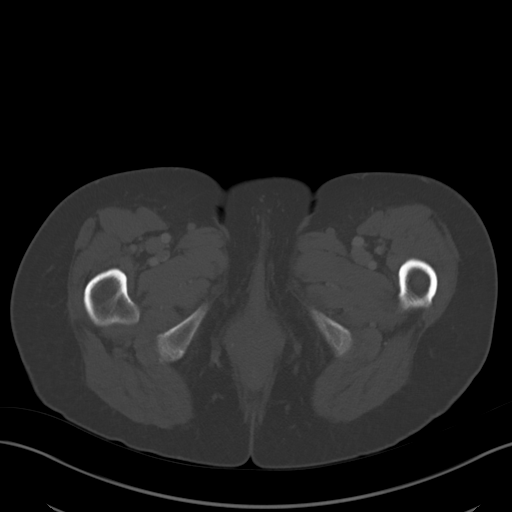
[im 16/97  soft-tissue]
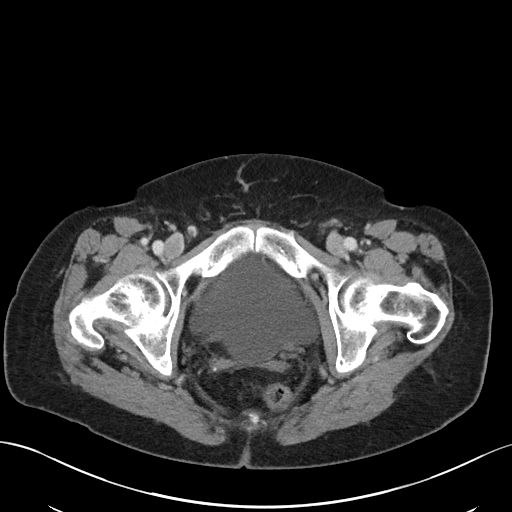
[im 26/97  soft-tissue]
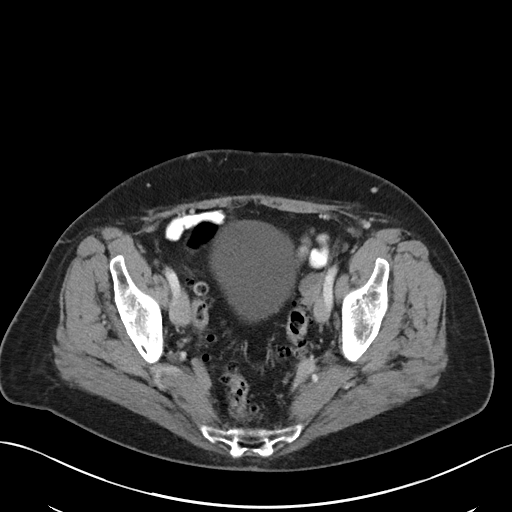
[im 31/97  soft-tissue]
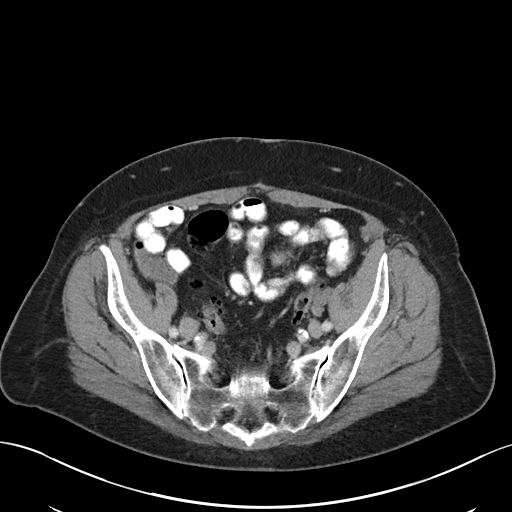
[im 41/97  soft-tissue]
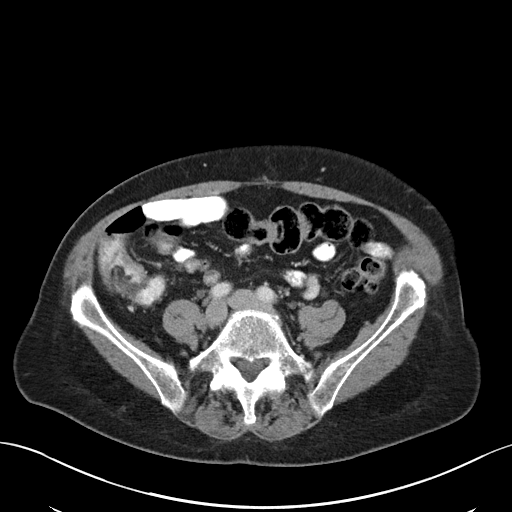
[im 51/97  soft-tissue]
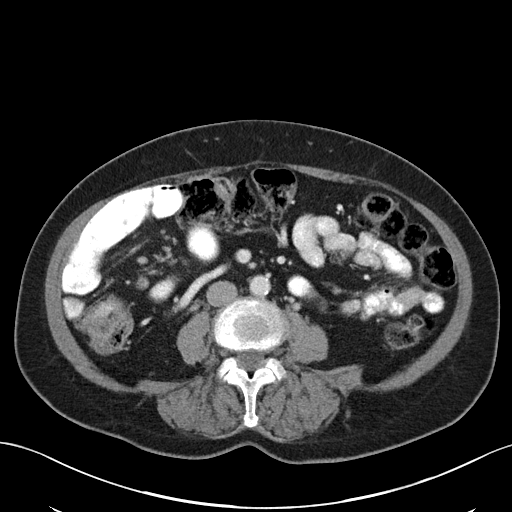
[im 56/97  soft-tissue]
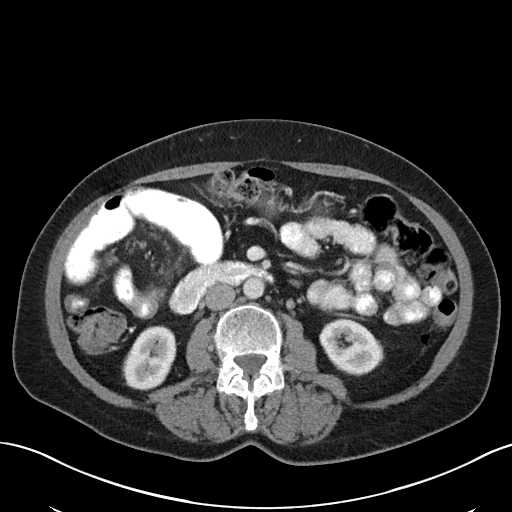
[im 66/97  soft-tissue]
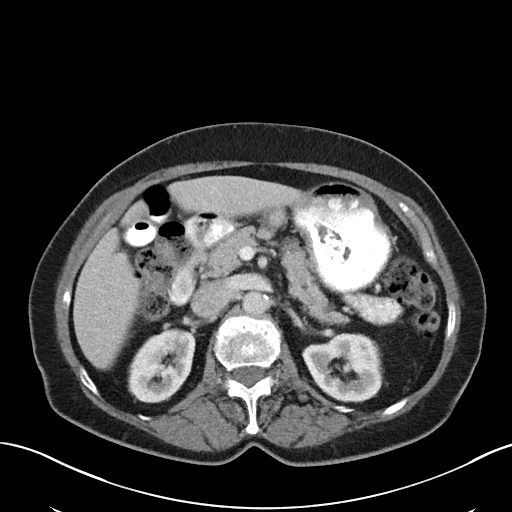
[im 71/97  soft-tissue]
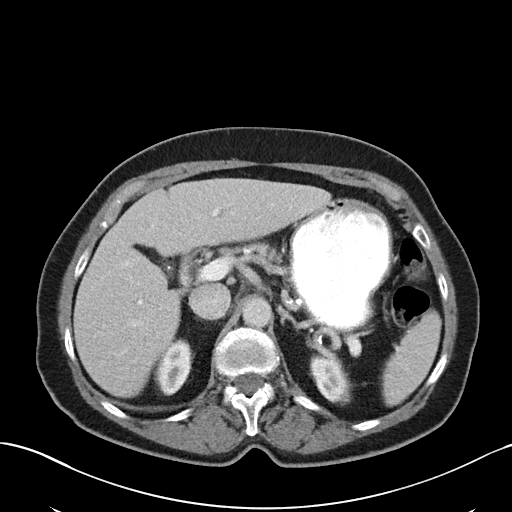
[im 71/97  bone]
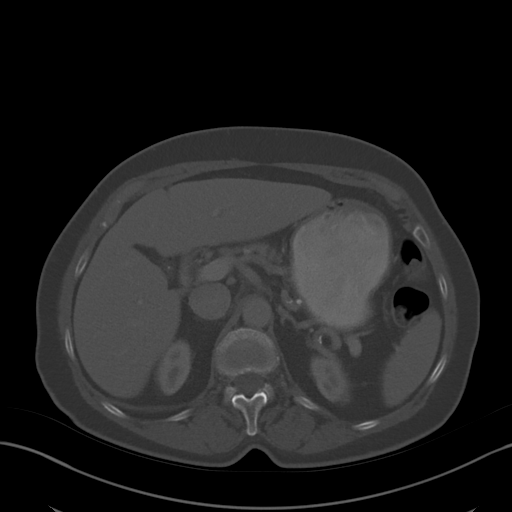
[im 81/97  soft-tissue]
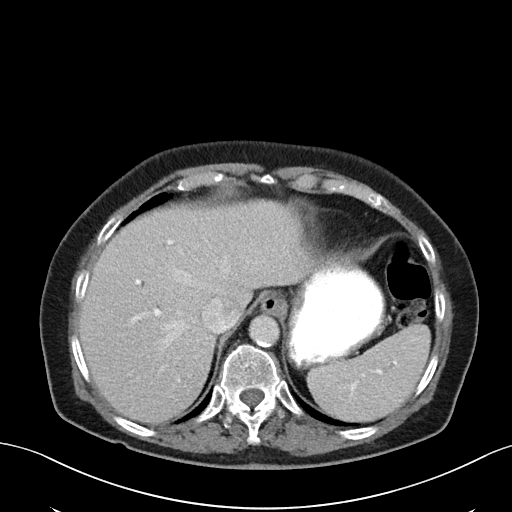
[im 91/97  soft-tissue]
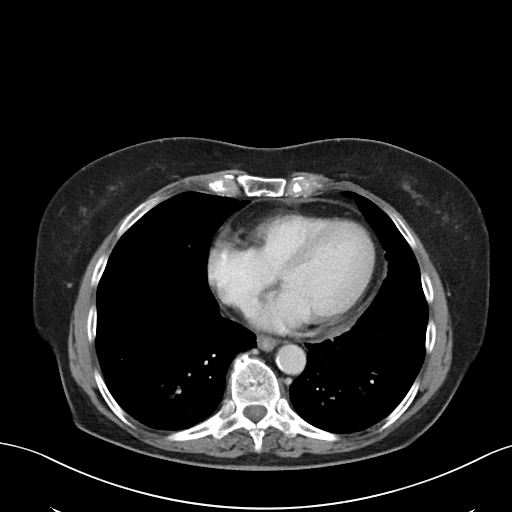

[Series 6: abd/pel w st · coronal · 0.70mm/px · 3 of 86 slices shown]
[im 29/86  soft-tissue]
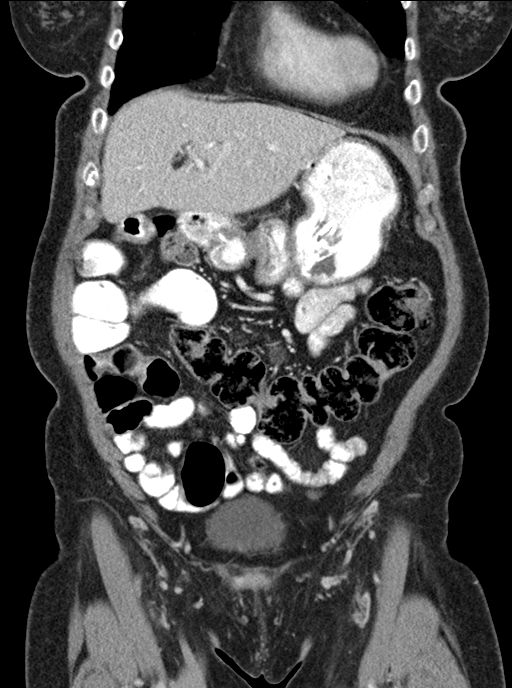
[im 38/86  soft-tissue]
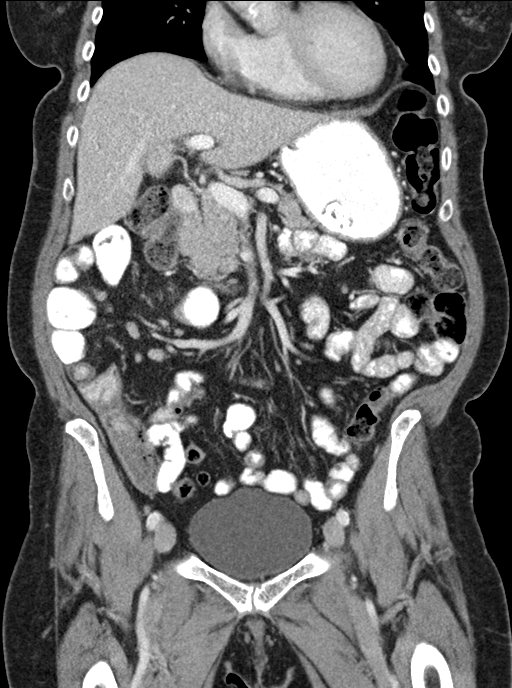
[im 48/86  soft-tissue]
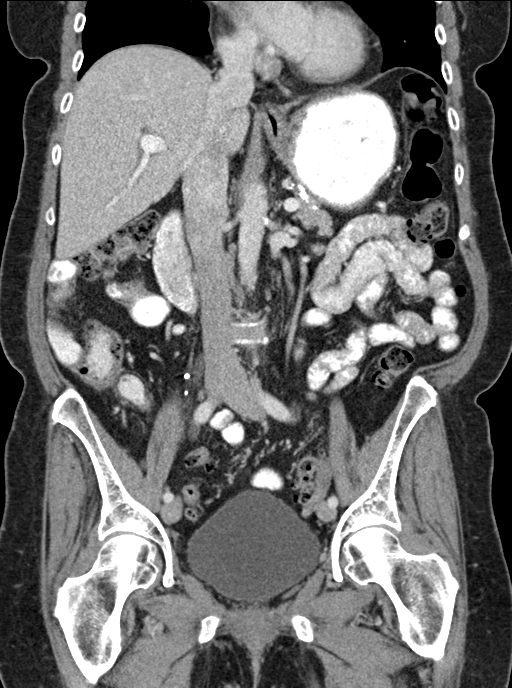

[14 of 46 positions shown; findings below may reference images not displayed]

FINDINGS: Lower chest: Limited visualization of the lower thorax demonstrates
minimal dependent subpleural ground-glass atelectasis. No discrete
focal airspace opacities. No pleural effusion.

Normal heart size. Trace amount of pericardial fluid, presumably
physiologic.

Hepatobiliary: There is mild nodularity of the hepatic contour. No
discrete hepatic lesions. Post cholecystectomy. There is very mild
intrahepatic biliary ductal dilatation, similar to the 3163
examination and presumably secondary to biliary reservoir phenomenon
in the setting of post cholecystectomy state. No extrahepatic
biliary duct dilatation. No ascites.

Pancreas: Normal appearance of the pancreas

Spleen: Normal appearance of the spleen

Adrenals/Urinary Tract: There is symmetric enhancement and excretion
of the bilateral kidneys. No definite renal stones on this
postcontrast examination. No discrete renal lesions. No urinary
obstruction or perinephric stranding.

Normal appearance of the bilateral adrenal glands.

Normal appearance of the urinary bladder given degree distention.

Stomach/Bowel: Ingested enteric contrast extends to the level of the
cecum. Large colonic stool burden without evidence of enteric
obstruction.

Extensive colonic diverticulosis without evidence superimposed acute
diverticulitis.

There is mild distention of several loops of small bowel the right
mid hemiabdomen with 4 discrete areas of apparent circumferential
bowel wall thickening of the distal small bowel (coronal images 36,
42, 46 and 50, series 6). Terminal ileum appears normal. The
appendix is not visualized compatible with provided operative
history.

No pneumoperitoneum, pneumatosis or portal venous gas.

Vascular/Lymphatic: Normal caliber of the abdominal aorta. The major
branch vessels of the abdominal aorta appear patent on this non CTA
examination.

Scattered mesenteric lymph nodes are numerous though individually
not enlarged by size criteria with index mesenteric lymph node
within the right lower abdominal quadrant measuring 0.9 cm in
greatest short axis diameter (coronal image 38, series 6). No bulky
retroperitoneal, mesenteric, pelvic or inguinal lymphadenopathy.

Reproductive: Post hysterectomy. No discrete adnexal lesion. No free
fluid in the pelvic cul-de-sac.

Other: Dystrophic calcification about the left buttocks. Regional
soft tissues appear otherwise normal.

Musculoskeletal: No acute or aggressive osseous abnormalities.
Bilateral facet degenerative change the lower lumbar spine.
IMPRESSION: 1. Extensive colonic diverticulosis without evidence of superimposed
acute diverticulitis.
2. Mild distention of several loops of small bowel within the right
mid hemiabdomen with potential discrete areas of circumferential
wall thickening within the upstream small bowel - while potentially
representative of peristaltic waves, inflammatory bowel disease
(such as Crohn's) could have a similar appearance. Clinical
correlation is advised. Further evaluation could be performed with
small bowel follow-through as indicated.
3. Post appendectomy.

## 2020-02-12 ENCOUNTER — Encounter: Payer: Self-pay | Admitting: Gastroenterology

## 2020-04-06 ENCOUNTER — Ambulatory Visit: Payer: Medicare Other | Admitting: Gastroenterology

## 2020-04-06 ENCOUNTER — Encounter: Payer: Self-pay | Admitting: Gastroenterology

## 2020-04-06 VITALS — BP 132/74 | HR 81 | Ht 64.0 in | Wt 146.0 lb

## 2020-04-06 DIAGNOSIS — R634 Abnormal weight loss: Secondary | ICD-10-CM | POA: Diagnosis not present

## 2020-04-06 DIAGNOSIS — K219 Gastro-esophageal reflux disease without esophagitis: Secondary | ICD-10-CM | POA: Diagnosis not present

## 2020-04-06 DIAGNOSIS — R1032 Left lower quadrant pain: Secondary | ICD-10-CM

## 2020-04-06 DIAGNOSIS — R1011 Right upper quadrant pain: Secondary | ICD-10-CM | POA: Diagnosis not present

## 2020-04-06 NOTE — Progress Notes (Signed)
Chief Complaint:   Referring Provider:  Hal Morales, NP      ASSESSMENT AND PLAN;   #1.  LLQ pain/RUQ pain. Nl CBC, CMP. Has H/O IBS-C, LBP d/t extensive spinal stenosis.  #2.  Unexplained wt loss  #3.  GERD  Plan: -CT AP with PO/IV constrast -Check Wt every week and record. If continued wt loss, will perform further work-up. -Continue protonix. -FU in 12 weeks -IBgard 1/day (samples given) -D/W patient and patient's husband in detail.   HPI:    [name] is a 79 y.o. female  With extensive sigmoid diverticulosis, IBS-C, LBP d/t spinal stenosis, H/O PSBO s/p LOA, HLD, s/p lap chole, OA, benign pancreatic cyst (stable, no further WU), OA, anxiety/depression  C/O abdo pain LLQ pain/RUQ x 6 months, worst after eating With associated bloating  occ N/V but only rarely  Used to be more constipated, better, has to strain at times, pellet like stools  No melena or hematochezia  Has lost weight as detailed below.  No fever chills or night sweats.  Has had very difficult colonoscopies.  Wt Readings from Last 3 Encounters:  04/06/20 146 lb (66.2 kg)  02/22/18 157 lb 6 oz (71.4 kg)   Past GI procedures: -Virtual colonoscopy (CT colography) 10/29/2014: Multiple diverticula throughout the colon primarily in rectosigmoid and descending colon.  Colon is tortuous and elongated.  Some circumferential mucosal thickening of the rectum.  Patient refused sigmoidoscopy.  Had another virtual colonoscopy 08/30/2009: Moderate to severe's rectosigmoid colonic diverticulosis.  -EGD 04/2011: Moderate gastritis.  Otherwise normal EGD.  Small periampullary diverticulum. -Attempted colonoscopy 04/02/2002: Negative to mid transverse colon.  Incomplete due to formation of multiple loops and diverticular disease/adhesions.  Had previous attempted colonoscopies by Dr. Manual Meier not be completed.  Past Medical History:  Diagnosis Date  . Chest discomfort   . Depression   .  Diverticulosis   . Dyspnea   . Fatigue   . Gastritis   . History of bronchitis   . History of thrombocytopenia    related to medications  . HLD (hyperlipidemia)   . HTN (hypertension)   . IBS (irritable bowel syndrome)   . Lichen planus    mouth, feet, hands  . Osteoarthritis     Past Surgical History:  Procedure Laterality Date  . APPENDECTOMY    . CATARACT EXTRACTION, BILATERAL    . CHOLECYSTECTOMY    . CT Virtual Colonscopy   10/29/2014  . ESOPHAGOGASTRODUODENOSCOPY  05/10/2011   Moderate gastritis. Otherwise normal EGD  . hysterectomy - unknown type    . knee surgery Bilateral    laporoscopic  . MOHS SURGERY     Basal Cell Carcinoma- right cheek    Family History  Problem Relation Age of Onset  . Coronary artery disease Other   . Other Mother        accident  . Other Father   . Heart disease Brother   . Heart disease Sister     Social History   Tobacco Use  . Smoking status: Never Smoker  . Smokeless tobacco: Never Used  Vaping Use  . Vaping Use: Never used  Substance Use Topics  . Alcohol use: No  . Drug use: No    Current Outpatient Medications  Medication Sig Dispense Refill  . Beta Carotene (VITAMIN A) 25000 UNIT capsule Take 25,000 Units by mouth every other day.    . calcium carbonate 200 MG capsule Take 250 mg by mouth every other day.      Marland Kitchen  Cholecalciferol (VITAMIN D3) 3000 UNITS TABS Take 5,000 Units by mouth every other day.     . Coenzyme Q10 (CO Q 10 PO) Take by mouth every other day.      . cyanocobalamin 500 MCG tablet Take 5,000 mcg by mouth every other day.     . Ferrous Sulfate (IRON PO) Take by mouth once a week.    . lovastatin (MEVACOR) 20 MG tablet Take 20 mg by mouth at bedtime.      . Multiple Vitamin (MULTIVITAMIN) tablet Take 1 tablet by mouth every other day.     . pantoprazole (PROTONIX) 40 MG tablet Take 1 tablet (40 mg total) by mouth daily. 30 tablet 3  . vitamin C (ASCORBIC ACID) 250 MG tablet Take 250 mg by mouth  every other day.    . vitamin E (VITAMIN E) 180 MG (400 UNITS) capsule Take 400 Units by mouth every other day.    . zinc gluconate 50 MG tablet Take 50 mg by mouth every other day.       No current facility-administered medications for this visit.    Allergies  Allergen Reactions  . Celebrex [Celecoxib]     Dizziness   . Codeine   . Morphine And Related   . Tramadol Nausea And Vomiting    Review of Systems:  Constitutional: Denies fever, chills, diaphoresis, appetite change and fatigue.  HEENT: Denies photophobia, [name] pain, redness, hearing loss, ear pain, congestion, sore throat, rhinorrhea, sneezing, mouth sores, neck pain, neck stiffness and tinnitus.   Respiratory: Denies SOB, DOE, cough, chest tightness,  and wheezing.   Cardiovascular: Denies chest pain, palpitations and leg swelling.  Genitourinary: Denies dysuria, urgency, frequency, hematuria, flank pain and difficulty urinating.  Musculoskeletal: Denies myalgias, back pain, joint swelling, arthralgias and gait problem.  Skin: No rash.  Neurological: Denies dizziness, seizures, syncope, weakness, light-headedness, numbness and headaches.  Hematological: Denies adenopathy. Easy bruising, personal or family bleeding history  Psychiatric/Behavioral: No anxiety or depression     Physical Exam:    BP 132/74   Pulse 81   Ht 5\' 4"  (1.626 m)   Wt 146 lb (66.2 kg)   BMI 25.06 kg/m  Wt Readings from Last 3 Encounters:  04/06/20 146 lb (66.2 kg)  02/22/18 157 lb 6 oz (71.4 kg)   Constitutional:  Well-developed, in no acute distress. Psychiatric: Normal mood and affect. Behavior is normal. HEENT: Pupils normal.  Conjunctivae are normal. No scleral icterus. Neck supple.  Cardiovascular: Normal rate, regular rhythm. No edema Pulmonary/chest: Effort normal and breath sounds normal. No wheezing, rales or rhonchi. Abdominal: Soft, nondistended. Nontender. Bowel sounds active throughout. There are no masses palpable. No  hepatomegaly. Rectal:  defered Neurological: Alert and oriented to person place and time. Skin: Skin is warm and dry. No rashes noted.  Data Reviewed: I have personally reviewed following labs and imaging studies  CBC: CBC Latest Ref Rng & Units 02/22/2018  WBC 4.0 - 10.5 K/uL 6.5  Hemoglobin 12.0 - 15.0 g/dL 04/24/2018  Hematocrit 36 - 46 % 40.2  Platelets 150 - 400 K/uL 179.0    CMP: CMP Latest Ref Rng & Units 02/22/2018  Glucose 70 - 99 mg/dL 04/24/2018)  BUN 6 - 23 mg/dL 7  Creatinine 751(W - 2.58 mg/dL 5.27  Sodium 7.82 - 423 mEq/L 140  Potassium 3.5 - 5.1 mEq/L 3.9  Chloride 96 - 112 mEq/L 103  CO2 19 - 32 mEq/L 30  Calcium 8.4 - 10.5 mg/dL 9.2  Total Protein  6.0 - 8.3 g/dL 6.7  Total Bilirubin 0.2 - 1.2 mg/dL 0.6  Alkaline Phos 39 - 117 U/L 62  AST 0 - 37 U/L 13  ALT 0 - 35 U/L 9      Edman Circle, MD 04/06/2020, 2:18 PM  Cc: Hal Morales, NP

## 2020-04-06 NOTE — Patient Instructions (Signed)
If you are age 80 or older, your body mass index should be between 23-30. Your Body mass index is 25.06 kg/m. If this is out of the aforementioned range listed, please consider follow up with your Primary Care Provider.  If you are age 59 or younger, your body mass index should be between 19-25. Your Body mass index is 25.06 kg/m. If this is out of the aformentioned range listed, please consider follow up with your Primary Care Provider  You have [facility] [place] [address] [facility]   You are scheduled on 04/09/20 at [facility] should arrive 15 minutes prior to your appointment time for registration. Please follow the written instructions below on the day of your exam:  WARNING: IF YOU ARE ALLERGIC TO IODINE/X-RAY DYE, PLEASE NOTIFY RADIOLOGY IMMEDIATELY AT [phone]! YOU WILL BE GIVEN A 13 HOUR PREMEDICATION PREP.  1) Do not eat or drink anything after 6am (4 hours prior to your test) 2) You have been given 2 bottles of oral contrast to drink. The solution may taste better if refrigerated, but do NOT add ice or any other liquid to this solution. Shake well before drinking.    Drink 1 bottle of contrast @ 8am (2 hours prior to your exam)  Drink 1 bottle of contrast @ 9am (1 hour prior to your exam)  You may take any medications as prescribed with a small amount of water, if necessary. If you take any of the following medications: METFORMIN, GLUCOPHAGE, GLUCOVANCE, AVANDAMET, RIOMET, FORTAMET, [place] MET, JANUMET, GLUMETZA or METAGLIP, you MAY be asked to HOLD this medication 48 hours AFTER the exam.  The purpose of you drinking the oral contrast is to aid in the visualization of your intestinal tract. The contrast solution may cause some diarrhea. Depending on your individual set of symptoms, you may also receive an intravenous injection of x-ray contrast/dye. Plan on being at [facility] for 30 minutes or longer, depending on the type of exam you are having performed.  This test typically takes 30-45 minutes to complete.  If you have any questions regarding your exam or if you need to reschedule, you may call the CT department at [phone] between the hours of 8:00 am and 5:00 pm, Monday-Friday.  ________________________________________________________________________  Please go next door to [address] to have your labs drawn before you leave today.   We have given you samples of the following medication to take: IBGard   Check weight once weekly and record.   Thank you,  Dr. Jackquline Denmark

## 2020-04-09 ENCOUNTER — Ambulatory Visit (HOSPITAL_BASED_OUTPATIENT_CLINIC_OR_DEPARTMENT_OTHER)
Admission: RE | Admit: 2020-04-09 | Discharge: 2020-04-09 | Disposition: A | Discharge status: Home / Self Care | Payer: Medicare Other | Source: Ambulatory Visit | Attending: Gastroenterology | Admitting: Gastroenterology

## 2020-04-09 ENCOUNTER — Other Ambulatory Visit: Payer: Self-pay

## 2020-04-09 ENCOUNTER — Encounter (HOSPITAL_BASED_OUTPATIENT_CLINIC_OR_DEPARTMENT_OTHER): Payer: Self-pay

## 2020-04-09 DIAGNOSIS — R634 Abnormal weight loss: Secondary | ICD-10-CM | POA: Insufficient documentation

## 2020-04-09 DIAGNOSIS — K219 Gastro-esophageal reflux disease without esophagitis: Secondary | ICD-10-CM | POA: Diagnosis present

## 2020-04-09 DIAGNOSIS — R1011 Right upper quadrant pain: Secondary | ICD-10-CM | POA: Diagnosis present

## 2020-04-09 DIAGNOSIS — R1032 Left lower quadrant pain: Secondary | ICD-10-CM | POA: Insufficient documentation

## 2020-04-09 HISTORY — DX: Type 2 diabetes mellitus without complications: E11.9

## 2020-04-09 MED ORDER — IOHEXOL 300 MG/ML  SOLN
100.0000 mL | Freq: Once | INTRAMUSCULAR | Status: AC | PRN
  Administered 2020-04-09: 100 mL via INTRAVENOUS

## 2020-06-21 ENCOUNTER — Encounter: Payer: Self-pay | Admitting: Gastroenterology

## 2020-06-21 ENCOUNTER — Ambulatory Visit: Payer: Medicare Other | Admitting: Gastroenterology

## 2020-06-21 VITALS — BP 124/64 | HR 88 | Ht 64.0 in | Wt 147.0 lb

## 2020-06-21 DIAGNOSIS — R1032 Left lower quadrant pain: Secondary | ICD-10-CM | POA: Diagnosis not present

## 2020-06-21 DIAGNOSIS — R1011 Right upper quadrant pain: Secondary | ICD-10-CM | POA: Diagnosis not present

## 2020-06-21 DIAGNOSIS — K219 Gastro-esophageal reflux disease without esophagitis: Secondary | ICD-10-CM

## 2020-06-21 DIAGNOSIS — R634 Abnormal weight loss: Secondary | ICD-10-CM | POA: Diagnosis not present

## 2020-06-21 NOTE — Progress Notes (Signed)
Chief Complaint: FU  Referring Provider:  Hal Morales, NP      ASSESSMENT AND PLAN;   #1.  LLQ pain/RUQ pain. Nl CBC, CMP. Has H/O IBS-C, LBP d/t extensive spinal stenosis. Neg CT AP 03/2020 for any acute abnormalities  #2.  Wt loss (resolved). Neg CT AP except for stable panc lesion  #3.  GERD  Plan: -Continue protonix 40mg  po qd. Can certainly try it every other day. -IBgard 1/day to continue (samples given) -D/W pt and pt's husband in detail. She would like to hold off on any endoscopic procedures at this time as she feels better. -FU in 6 months.  Consider repeating CT or MRI pancreas in 2 yrs.   HPI:    [name] is a 79 y.o. female  With extensive sigmoid diverticulosis, IBS-C, LBP d/t spinal stenosis, H/O PSBO s/p LOA, HLD, s/p lap chole, OA, benign pancreatic cyst (stable, no further WU), OA, hypothyroidism anxiety/depression  Feels much better on IBgard. Had negative CT Abdo/pelvis 04/09/2020 except for small cystic lesion in the tail of the pancreas.  Her abdominal bloating is much better.  No further weight loss.  Constipation is better.  Thyroid meds being adjusted by 04/11/2020.  No melena or hematochezia.  No fever chills or night sweats.  She denies having any heartburn, nausea/vomiting.  Wt Readings from Last 3 Encounters:  06/21/20 147 lb (66.7 kg)  04/06/20 146 lb (66.2 kg)  02/22/18 157 lb 6 oz (71.4 kg)   Past GI procedures: CT AP 04/09/2020 1. No acute findings in the abdomen or pelvis. 2. No explanation for LEFT lower quadrant pain. 3. Postcholecystectomy and hysterectomy. 4. Stable small cystic lesion in the tail the pancreas is favored benign post inflammatory change. Recommend follow up pre and post contrast MRI/MRCP or pancreatic protocol CT in 2 years  -Virtual colonoscopy (CT colography) 10/29/2014: Multiple diverticula throughout the colon primarily in rectosigmoid and descending colon.  Colon is tortuous and elongated.  Some  circumferential mucosal thickening of the rectum.  Patient refused sigmoidoscopy.  Had another virtual colonoscopy 08/30/2009: Moderate to severe's rectosigmoid colonic diverticulosis.  -EGD 04/2011: Moderate gastritis.  Otherwise normal EGD.  Small periampullary diverticulum.  -Attempted colonoscopy 04/02/2002: Neg to mid transverse colon.  Incomplete due to formation of multiple loops and diverticular disease/adhesions.  Had previous attempted colonoscopies by Dr. 06/02/2002 not be completed.  Past Medical History:  Diagnosis Date  . Chest discomfort   . Depression   . Diabetes mellitus without complication (HCC)   . Diverticulosis   . Dyspnea   . Fatigue   . Gastritis   . History of bronchitis   . History of thrombocytopenia    related to medications  . HLD (hyperlipidemia)   . HTN (hypertension)   . IBS (irritable bowel syndrome)   . Lichen planus    mouth, feet, hands  . Osteoarthritis     Past Surgical History:  Procedure Laterality Date  . APPENDECTOMY    . CATARACT EXTRACTION, BILATERAL    . CHOLECYSTECTOMY    . CT Virtual Colonscopy   10/29/2014  . ESOPHAGOGASTRODUODENOSCOPY  05/10/2011   Moderate gastritis. Otherwise normal EGD  . hysterectomy - unknown type    . knee surgery Bilateral    laporoscopic  . MOHS SURGERY     Basal Cell Carcinoma- right cheek    Family History  Problem Relation Age of Onset  . Coronary artery disease Other   . Other Mother  accident  . Other Father   . Heart disease Brother   . Heart disease Sister     Social History   Tobacco Use  . Smoking status: Never Smoker  . Smokeless tobacco: Never Used  Vaping Use  . Vaping Use: Never used  Substance Use Topics  . Alcohol use: No  . Drug use: No    Current Outpatient Medications  Medication Sig Dispense Refill  . Beta Carotene (VITAMIN A) 25000 UNIT capsule Take 25,000 Units by mouth every other day.    . calcium carbonate 200 MG capsule Take 250 mg by mouth every  other day.      . Cholecalciferol (VITAMIN D3) 3000 UNITS TABS Take 5,000 Units by mouth every other day.     . Coenzyme Q10 (CO Q 10 PO) Take by mouth every other day.      . cyanocobalamin 500 MCG tablet Take 5,000 mcg by mouth every other day.     . Ferrous Sulfate (IRON PO) Take by mouth once a week.    . levothyroxine (SYNTHROID) 25 MCG tablet Take 25 mcg by mouth daily.    Marland Kitchen lovastatin (MEVACOR) 20 MG tablet Take 20 mg by mouth at bedtime.      . Multiple Vitamin (MULTIVITAMIN) tablet Take 1 tablet by mouth every other day.     . pantoprazole (PROTONIX) 40 MG tablet Take 1 tablet (40 mg total) by mouth daily. 30 tablet 3  . vitamin C (ASCORBIC ACID) 250 MG tablet Take 250 mg by mouth every other day.    . vitamin E (VITAMIN E) 180 MG (400 UNITS) capsule Take 400 Units by mouth every other day.    . zinc gluconate 50 MG tablet Take 50 mg by mouth every other day.       No current facility-administered medications for this visit.    Allergies  Allergen Reactions  . Celebrex [Celecoxib]     Dizziness   . Codeine   . Morphine And Related   . Tramadol Nausea And Vomiting    Review of Systems:  neg     Physical Exam:    BP 124/64   Pulse 88   Ht 5\' 4"  (1.626 m)   Wt 147 lb (66.7 kg)   BMI 25.23 kg/m  Wt Readings from Last 3 Encounters:  06/21/20 147 lb (66.7 kg)  04/06/20 146 lb (66.2 kg)  02/22/18 157 lb 6 oz (71.4 kg)   Constitutional:  Well-developed, in no acute distress. Psychiatric: Normal mood and affect. Behavior is normal. HEENT: Pupils normal.  Conjunctivae are normal. No scleral icterus. Abdominal: Soft, nondistended. Nontender. Bowel sounds active throughout. There are no masses palpable. No hepatomegaly. Rectal:  defered Neurological: Alert and oriented to person place and time. Skin: Skin is warm and dry. No rashes noted.  Data Reviewed: I have personally reviewed following labs and imaging studies  CBC: CBC Latest Ref Rng & Units 02/22/2018  WBC  4.0 - 10.5 K/uL 6.5  Hemoglobin 12.0 - 15.0 g/dL 04/24/2018  Hematocrit 36 - 46 % 40.2  Platelets 150 - 400 K/uL 179.0    CMP: CMP Latest Ref Rng & Units 02/22/2018  Glucose 70 - 99 mg/dL 04/24/2018)  BUN 6 - 23 mg/dL 7  Creatinine 263(Z - 8.58 mg/dL 8.50  Sodium 2.77 - 412 mEq/L 140  Potassium 3.5 - 5.1 mEq/L 3.9  Chloride 96 - 112 mEq/L 103  CO2 19 - 32 mEq/L 30  Calcium 8.4 - 10.5 mg/dL 9.2  Total Protein 6.0 - 8.3 g/dL 6.7  Total Bilirubin 0.2 - 1.2 mg/dL 0.6  Alkaline Phos 39 - 117 U/L 62  AST 0 - 37 U/L 13  ALT 0 - 35 U/L 9      Edman Circle, MD 06/21/2020, 2:51 PM  Cc: Hal Morales, NP

## 2020-06-21 NOTE — Patient Instructions (Signed)
If you are age 80 or older, your body mass index should be between 23-30. Your Body mass index is 25.23 kg/m. If this is out of the aforementioned range listed, please consider follow up with your Primary Care Provider.  If you are age 36 or younger, your body mass index should be between 19-25. Your Body mass index is 25.23 kg/m. If this is out of the aformentioned range listed, please consider follow up with your Primary Care Provider.   We have given you samples of the following medication to take: IBGard   Follow up in 6 months.    Thank you,  Dr. Lynann Bologna

## 2021-04-02 ENCOUNTER — Other Ambulatory Visit: Payer: Self-pay | Admitting: Gastroenterology

## 2021-04-02 DIAGNOSIS — R1011 Right upper quadrant pain: Secondary | ICD-10-CM

## 2021-04-02 DIAGNOSIS — R1032 Left lower quadrant pain: Secondary | ICD-10-CM

## 2021-04-02 DIAGNOSIS — K219 Gastro-esophageal reflux disease without esophagitis: Secondary | ICD-10-CM

## 2021-04-02 DIAGNOSIS — R634 Abnormal weight loss: Secondary | ICD-10-CM

## 2021-05-27 ENCOUNTER — Other Ambulatory Visit: Payer: Self-pay

## 2021-05-27 ENCOUNTER — Ambulatory Visit (INDEPENDENT_AMBULATORY_CARE_PROVIDER_SITE_OTHER): Payer: Medicare Other | Admitting: Gastroenterology

## 2021-05-27 ENCOUNTER — Other Ambulatory Visit (INDEPENDENT_AMBULATORY_CARE_PROVIDER_SITE_OTHER): Payer: Medicare Other

## 2021-05-27 ENCOUNTER — Encounter: Payer: Self-pay | Admitting: Gastroenterology

## 2021-05-27 VITALS — BP 126/82 | HR 68 | Ht 64.0 in | Wt 141.4 lb

## 2021-05-27 DIAGNOSIS — R1032 Left lower quadrant pain: Secondary | ICD-10-CM | POA: Diagnosis not present

## 2021-05-27 DIAGNOSIS — K219 Gastro-esophageal reflux disease without esophagitis: Secondary | ICD-10-CM

## 2021-05-27 DIAGNOSIS — R634 Abnormal weight loss: Secondary | ICD-10-CM

## 2021-05-27 MED ORDER — PANTOPRAZOLE SODIUM 40 MG PO TBEC: 40.0000 mg | DELAYED_RELEASE_TABLET | Freq: Every day | ORAL | 11 refills | Status: DC

## 2021-05-27 NOTE — Patient Instructions (Addendum)
If you are age 81 or older, your body mass index should be between 23-30. Your Body mass index is 24.27 kg/m. If this is out of the aforementioned range listed, please consider follow up with your Primary Care Provider.  If you are age 69 or younger, your body mass index should be between 19-25. Your Body mass index is 24.27 kg/m. If this is out of the aformentioned range listed, please consider follow up with your Primary Care Provider.   __________________________________________________________  The [facility] providers would like to encourage you to use [facility] to communicate with providers for non-urgent requests or questions.  Due to long hold times on the telephone, sending your provider a message by [facility] may be a faster and more efficient way to get a response.  Please allow 48 business hours for a response.  Please remember that this is for non-urgent requests.   We have sent the following medications to your pharmacy for you to pick up at your convenience: Protonix 40 mg daily  Over the counter IB guard 1 tablet twice daily until better then take 1 tablet daily   Please go to the lab on the 2nd floor [address] before you leave the office today.   Please go to 1st floor radiology and schedule your cat scan.  It was a pleasure to see you today!  Lynann Bologna, M.D.

## 2021-05-27 NOTE — Progress Notes (Signed)
Chief Complaint: FU  Referring Provider:  Charlynn Court, NP      ASSESSMENT AND PLAN;   #1.  LLQ pain. Nl CBC, CMP. Has H/O IBS-C, LBP d/t extensive spinal stenosis. Neg CT AP 03/2020 for any acute abnormalities  #2.  Wt loss  #3.  GERD  Plan: -Continue protonix 40mg  po qd #90, 11 refills -IBgard 1 BID until better, then QD. -CBC, CMP, TSH -CT AP IV/PO contrast. -D/W pt and pt's husband in detail. She would like to hold off on any endoscopic procedures at this time. -FU thereafter if still with pronb;le,ms   HPI:    [name] is a 80 y.o. female  With extensive sigmoid diverticulosis, IBS-C, LBP d/t spinal stenosis, H/O PSBO s/p LOA, HLD, s/p lap chole, OA, benign pancreatic cyst (stable, no further WU), OA, hypothyroidism anxiety/depression, vertigo  Youngest daughter at age 104 died d/t CAD  Has been having left lower quadrant abdominal pain with abdominal bloating.  No further constipation.  She is having 2-3 BMs per day.  She has felt better with IBgard.  Has lost 6 pounds since the last visit.  This is despite good appetite.  She is very much concerned about pancreatic lesions.  Thyroid meds being adjusted by Lucita Lora.  No melena or hematochezia.  No fever chills or night sweats.  She denies having any heartburn, nausea/vomiting.  Wt Readings from Last 3 Encounters:  05/27/21 141 lb 6 oz (64.1 kg)  06/21/20 147 lb (66.7 kg)  04/06/20 146 lb (66.2 kg)   Past GI procedures: CT AP 04/09/2020 1. No acute findings in the abdomen or pelvis. 2. No explanation for LEFT lower quadrant pain. 3. Postcholecystectomy and hysterectomy. 4. Stable small cystic lesion in the tail the pancreas is favored benign post inflammatory change. Recommend follow up pre and post contrast MRI/MRCP or pancreatic protocol CT in 2 years  -Virtual colonoscopy (CT colography) 10/29/2014: Multiple diverticula throughout the colon primarily in rectosigmoid and descending colon.  Colon is  tortuous and elongated.  Some circumferential mucosal thickening of the rectum.  Patient refused sigmoidoscopy.  Had another virtual colonoscopy 08/30/2009: Moderate to severe's rectosigmoid colonic diverticulosis.  -EGD 04/2011: Moderate gastritis.  Otherwise normal EGD.  Small periampullary diverticulum.  -Attempted colonoscopy 04/02/2002: Neg to mid transverse colon.  Incomplete due to formation of multiple loops and diverticular disease/adhesions.  Had previous attempted colonoscopies by Dr. Rachell Cipro not be completed.  Past Medical History:  Diagnosis Date   Chest discomfort    Depression    Diabetes mellitus without complication (HCC)    Diverticulosis    Dyspnea    Fatigue    Gastritis    History of bronchitis    History of thrombocytopenia    related to medications   HLD (hyperlipidemia)    HTN (hypertension)    IBS (irritable bowel syndrome)    Lichen planus    mouth, feet, hands   Osteoarthritis     Past Surgical History:  Procedure Laterality Date   APPENDECTOMY     CATARACT EXTRACTION, BILATERAL     CHOLECYSTECTOMY     CT Virtual Colonscopy   10/29/2014   ESOPHAGOGASTRODUODENOSCOPY  05/10/2011   Moderate gastritis. Otherwise normal EGD   hysterectomy - unknown type     knee surgery Bilateral    laporoscopic   MOHS SURGERY     Basal Cell Carcinoma- right cheek    Family History  Problem Relation Age of Onset   Coronary artery disease  Other    Other Mother        accident   Other Father    Heart disease Brother    Heart disease Sister     Social History   Tobacco Use   Smoking status: Never   Smokeless tobacco: Never  Vaping Use   Vaping Use: Never used  Substance Use Topics   Alcohol use: No   Drug use: No    Current Outpatient Medications  Medication Sig Dispense Refill   Beta Carotene (VITAMIN A) 25000 UNIT capsule Take 25,000 Units by mouth every other day.     calcium carbonate 200 MG capsule Take 250 mg by mouth every other day.        Cholecalciferol (VITAMIN D3) 3000 UNITS TABS Take 5,000 Units by mouth every other day.      Coenzyme Q10 (CO Q 10 PO) Take by mouth every other day.       cyanocobalamin 500 MCG tablet Take 5,000 mcg by mouth every other day.      Ferrous Sulfate (IRON PO) Take by mouth once a week.     levothyroxine (SYNTHROID) 25 MCG tablet Take 25 mcg by mouth daily.     lovastatin (MEVACOR) 20 MG tablet Take 20 mg by mouth at bedtime.       Multiple Vitamin (MULTIVITAMIN) tablet Take 1 tablet by mouth every other day.      pantoprazole (PROTONIX) 40 MG tablet Take 1 tablet (40 mg total) by mouth daily. 30 tablet 3   vitamin C (ASCORBIC ACID) 250 MG tablet Take 250 mg by mouth every other day.     vitamin E 180 MG (400 UNITS) capsule Take 400 Units by mouth every other day.     zinc gluconate 50 MG tablet Take 50 mg by mouth every other day.       metFORMIN (GLUCOPHAGE-XR) 500 MG 24 hr tablet Take 500 mg by mouth daily.     No current facility-administered medications for this visit.    Allergies  Allergen Reactions   Celebrex [Celecoxib]     Dizziness    Codeine    Morphine And Related    Tramadol Nausea And Vomiting    Review of Systems:  neg     Physical Exam:    BP 126/82   Pulse 68   Ht 5\' 4"  (1.626 m)   Wt 141 lb 6 oz (64.1 kg)   SpO2 95%   BMI 24.27 kg/m  Wt Readings from Last 3 Encounters:  05/27/21 141 lb 6 oz (64.1 kg)  06/21/20 147 lb (66.7 kg)  04/06/20 146 lb (66.2 kg)   Constitutional:  Well-developed, in no acute distress. Psychiatric: Normal mood and affect. Behavior is normal. HEENT: Pupils normal.  Conjunctivae are normal. No scleral icterus. Abdominal: Soft, nondistended.  Mild left lower quadrant abdominal tenderness without rebound bowel sounds active throughout. There are no masses palpable. No hepatomegaly. Rectal:  defered Neurological: Alert and oriented to person place and time. Skin: Skin is warm and dry. No rashes noted.  Data Reviewed: I have  personally reviewed following labs and imaging studies  CBC: CBC Latest Ref Rng & Units 02/22/2018  WBC 4.0 - 10.5 K/uL 6.5  Hemoglobin 12.0 - 15.0 g/dL 04/24/2018  Hematocrit 97.3 - 46.0 % 40.2  Platelets 150.0 - 400.0 K/uL 179.0    CMP: CMP Latest Ref Rng & Units 02/22/2018  Glucose 70 - 99 mg/dL 04/24/2018)  BUN 6 - 23 mg/dL 7  Creatinine 992(E -  1.20 mg/dL 0.69  Sodium 135 - 145 mEq/L 140  Potassium 3.5 - 5.1 mEq/L 3.9  Chloride 96 - 112 mEq/L 103  CO2 19 - 32 mEq/L 30  Calcium 8.4 - 10.5 mg/dL 9.2  Total Protein 6.0 - 8.3 g/dL 6.7  Total Bilirubin 0.2 - 1.2 mg/dL 0.6  Alkaline Phos 39 - 117 U/L 62  AST 0 - 37 U/L 13  ALT 0 - 35 U/L 9      Carmell Austria, MD 05/27/2021, 2:58 PM  Cc: Charlynn Court, NP

## 2021-05-28 LAB — CBC WITH DIFFERENTIAL/PLATELET
Absolute Monocytes: 765 cells/uL (ref 200–950)
Basophils Absolute: 94 cells/uL (ref 0–200)
Basophils Relative: 1.1 %
Eosinophils Absolute: 60 cells/uL (ref 15–500)
Eosinophils Relative: 0.7 %
HCT: 41.5 % (ref 35.0–45.0)
Hemoglobin: 14 g/dL (ref 11.7–15.5)
Lymphs Abs: 2431 cells/uL (ref 850–3900)
MCH: 28.6 pg (ref 27.0–33.0)
MCHC: 33.7 g/dL (ref 32.0–36.0)
MCV: 84.7 fL (ref 80.0–100.0)
MPV: 11.4 fL (ref 7.5–12.5)
Monocytes Relative: 9 %
Neutro Abs: 5151 cells/uL (ref 1500–7800)
Neutrophils Relative %: 60.6 %
Platelets: 223 10*3/uL (ref 140–400)
RBC: 4.9 10*6/uL (ref 3.80–5.10)
RDW: 13.4 % (ref 11.0–15.0)
Total Lymphocyte: 28.6 %
WBC: 8.5 10*3/uL (ref 3.8–10.8)

## 2021-05-28 LAB — COMPREHENSIVE METABOLIC PANEL
AG Ratio: 2.2 (calc) (ref 1.0–2.5)
ALT: 11 U/L (ref 6–29)
AST: 16 U/L (ref 10–35)
Albumin: 4.4 g/dL (ref 3.6–5.1)
Alkaline phosphatase (APISO): 56 U/L (ref 37–153)
BUN: 13 mg/dL (ref 7–25)
CO2: 24 mmol/L (ref 20–32)
Calcium: 9.4 mg/dL (ref 8.6–10.4)
Chloride: 101 mmol/L (ref 98–110)
Creat: 0.72 mg/dL (ref 0.60–0.95)
Globulin: 2 g/dL (calc) (ref 1.9–3.7)
Glucose, Bld: 115 mg/dL — ABNORMAL HIGH (ref 65–99)
Potassium: 4.4 mmol/L (ref 3.5–5.3)
Sodium: 138 mmol/L (ref 135–146)
Total Bilirubin: 0.4 mg/dL (ref 0.2–1.2)
Total Protein: 6.4 g/dL (ref 6.1–8.1)

## 2021-05-28 LAB — TSH: TSH: 8.7 mIU/L — ABNORMAL HIGH (ref 0.40–4.50)

## 2021-06-08 ENCOUNTER — Other Ambulatory Visit: Payer: Self-pay

## 2021-06-08 ENCOUNTER — Encounter (HOSPITAL_BASED_OUTPATIENT_CLINIC_OR_DEPARTMENT_OTHER): Payer: Self-pay

## 2021-06-08 ENCOUNTER — Ambulatory Visit (HOSPITAL_BASED_OUTPATIENT_CLINIC_OR_DEPARTMENT_OTHER)
Admission: RE | Admit: 2021-06-08 | Discharge: 2021-06-08 | Disposition: A | Discharge status: Home / Self Care | Payer: Medicare Other | Source: Ambulatory Visit | Attending: Gastroenterology | Admitting: Gastroenterology

## 2021-06-08 DIAGNOSIS — R1032 Left lower quadrant pain: Secondary | ICD-10-CM | POA: Diagnosis not present

## 2021-06-08 DIAGNOSIS — R634 Abnormal weight loss: Secondary | ICD-10-CM | POA: Diagnosis present

## 2021-06-08 DIAGNOSIS — K219 Gastro-esophageal reflux disease without esophagitis: Secondary | ICD-10-CM | POA: Diagnosis present

## 2021-06-08 MED ORDER — IOHEXOL 300 MG/ML  SOLN
100.0000 mL | Freq: Once | INTRAMUSCULAR | Status: AC | PRN
  Administered 2021-06-08: 100 mL via INTRAVENOUS

## 2023-01-07 IMAGING — CT CT ABD-PEL WO/W CM
2 of 9 series · 13 of 46 positions shown, 15 images · IV contrast (Omnipaque)
Comparison: Multiple exams, including 04/09/2020

CLINICAL DATA: Abdominal pain and weight loss.  Bloating.

EXAM:
CT ABDOMEN AND PELVIS WITHOUT AND WITH CONTRAST
TECHNIQUE: Multidetector CT imaging of the abdomen and pelvis was performed
following the standard protocol before and following the bolus
administration of intravenous contrast.
CONTRAST:  100mL OMNIPAQUE IOHEXOL 300 MG/ML  SOLN

[Series 2: axial st · axial · 0.90mm/px · z∈[-445,-40]mm · 10 of 99 slices shown, 12 images]
[im 9/99  soft-tissue]
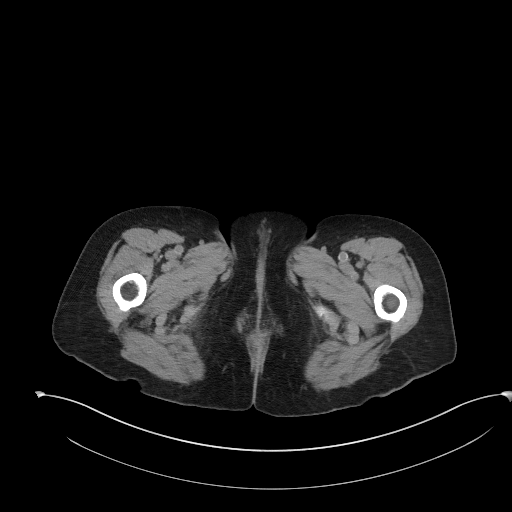
[im 9/99  bone]
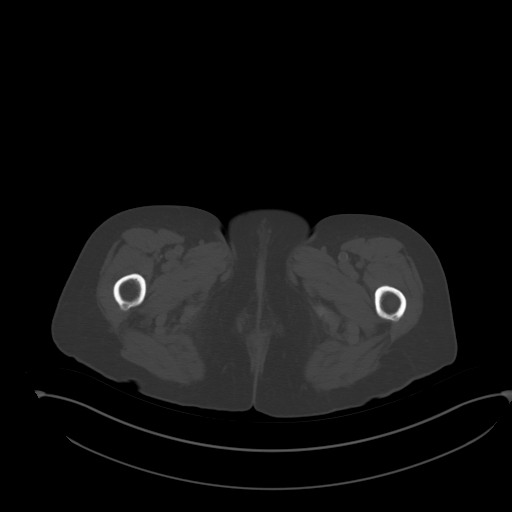
[im 18/99  soft-tissue]
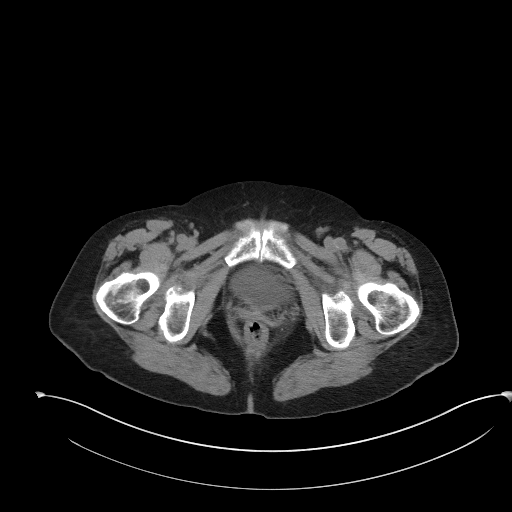
[im 27/99  soft-tissue]
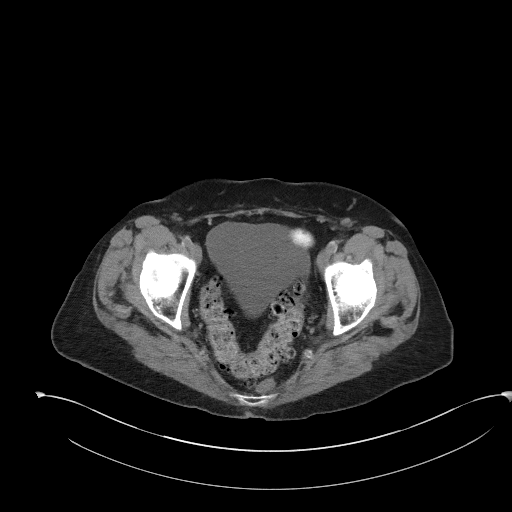
[im 36/99  soft-tissue]
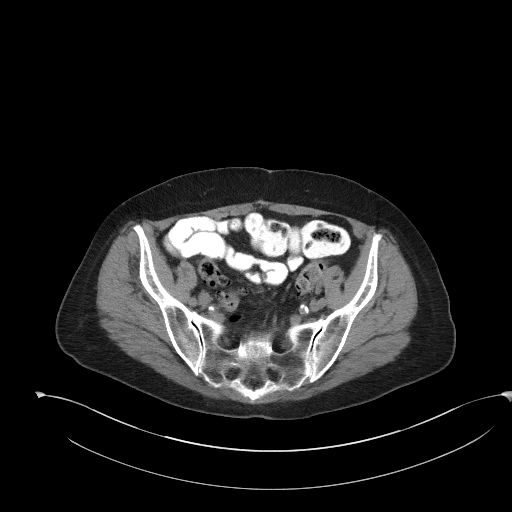
[im 45/99  soft-tissue]
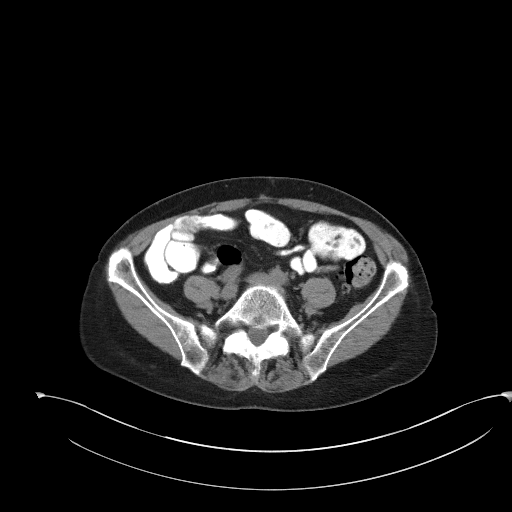
[im 54/99  soft-tissue]
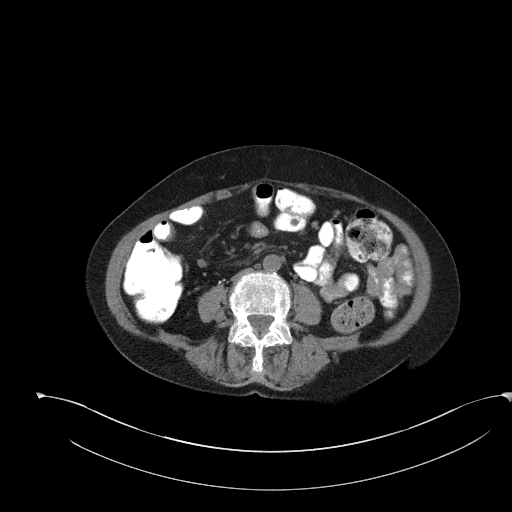
[im 63/99  soft-tissue]
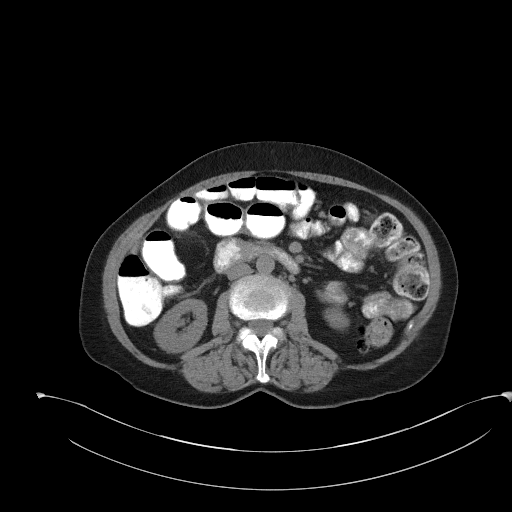
[im 72/99  soft-tissue]
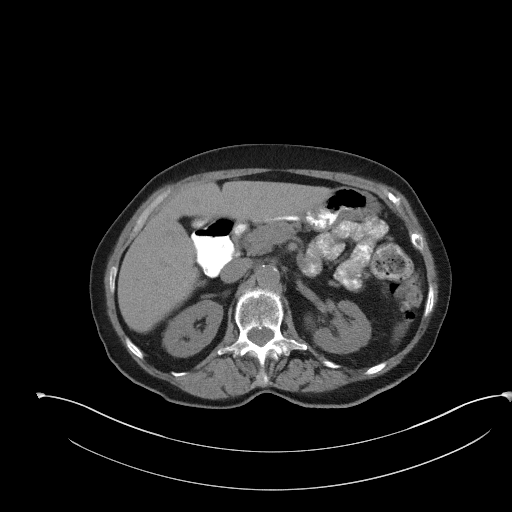
[im 81/99  soft-tissue]
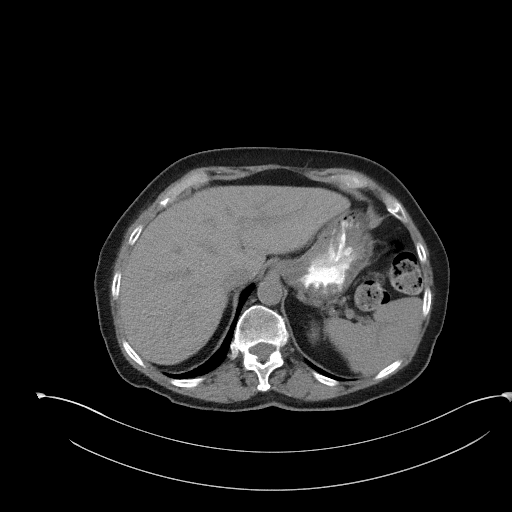
[im 81/99  bone]
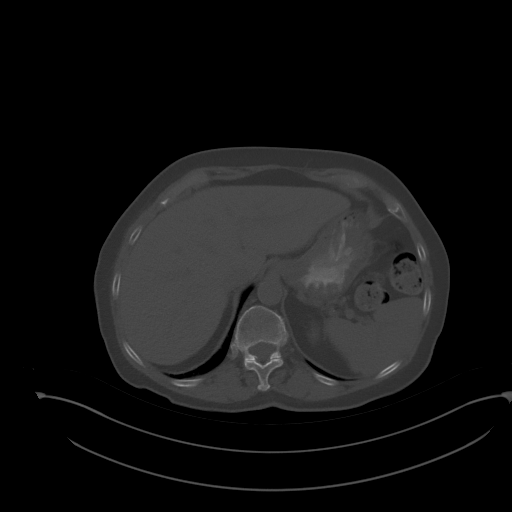
[im 90/99  soft-tissue]
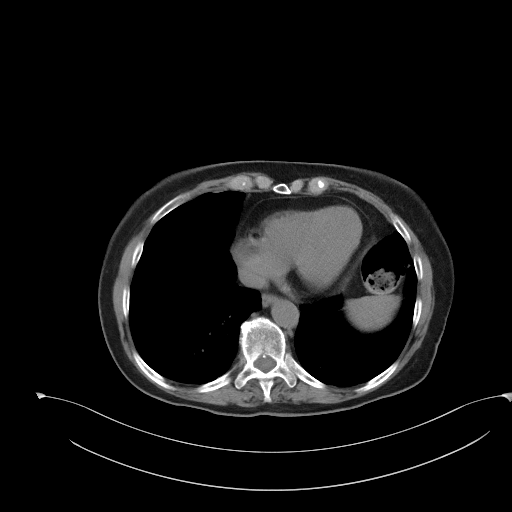

[Series 5: coronal st · coronal · 0.77mm/px · 3 of 84 slices shown]
[im 21/84  soft-tissue]
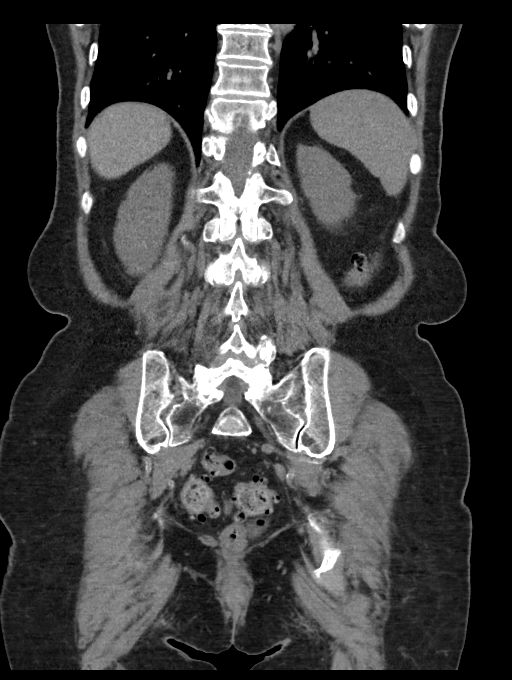
[im 42/84  soft-tissue]
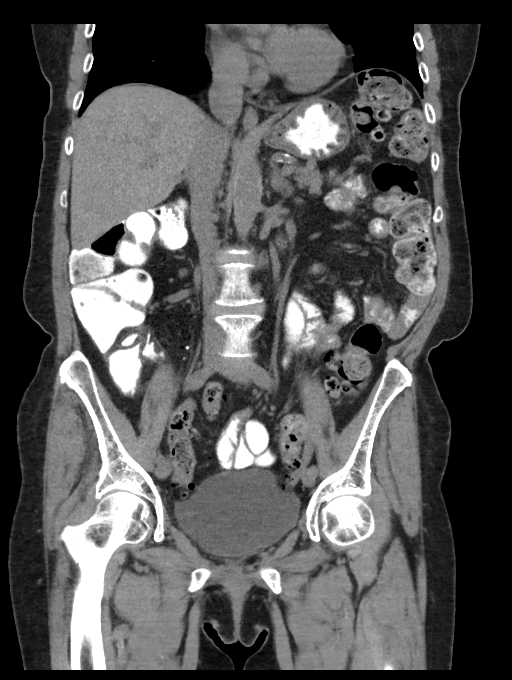
[im 63/84  soft-tissue]
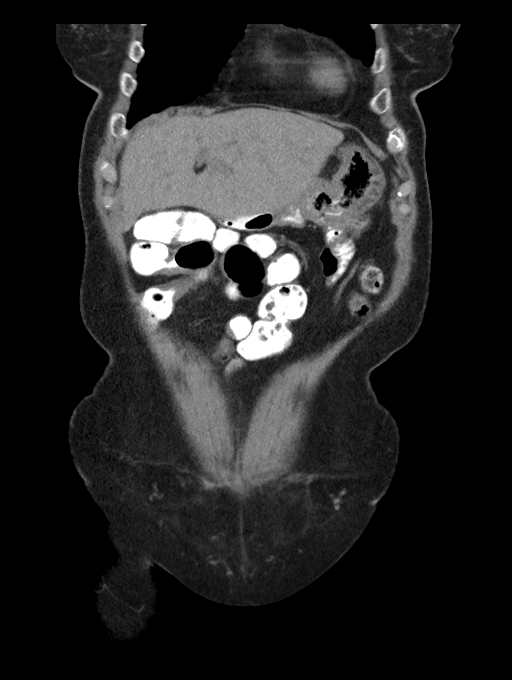

[13 of 46 positions shown; findings below may reference images not displayed]

FINDINGS: Lower chest: Descending thoracic aortic atherosclerotic
calcification. Borderline cardiomegaly.

Hepatobiliary: Minimal intrahepatic biliary prominence similar to
the prior exam. Common bile duct 0.6 cm in diameter. Prior
cholecystectomy. The biliary prominence may be a physiologic
response to cholecystectomy. The liver appears otherwise
unremarkable.

Pancreas: 0.7 by 0.9 cystic lesion along the pancreatic tail on
image 23 series 7. By my measurement back on 04/11/2011 this same
lesion measured 0.9 by 0.6 cm. The lack of substantial change over
the last 10 years strongly favors a benign etiology such as a
postinflammatory cystic lesion.

Spleen: Unremarkable

Adrenals/Urinary Tract: 0.8 by 0.56 cm hypodense lesion of the right
kidney upper pole, likely a cyst although technically too small to
characterize. Urinary bladder unremarkable. No hydronephrosis or
urinary tract calcification. No significant abnormal renal
parenchymal abnormality. The adrenal glands appear normal.

Stomach/Bowel: Distal descending and sigmoid colon diverticulosis
without active diverticulitis. Redundant transverse colon.
Borderline dilated loop of right lower quadrant small bowel noted
without specific abnormality in the terminal ileum. There continue
to be some mildly prominent right ileocolic lymph nodes including a
1.1 cm node on image 48 of series 7, previously 1.2 cm on 04/09/2020
and previously 1.2 cm on 03/01/2018. The appendix is surgically
absent. There are some areas of distal small bowel narrowing with
borderline wall thickening although this could be from peristaltic
activity. No definite compelling findings of inflammatory bowel
disease.

Vascular/Lymphatic: Atherosclerosis is present, including aortoiliac
atherosclerotic disease. Narrow proximal celiac trunk suggested on
image 64 of series 11 (although today's exam is not a CT angiogram
and is not ideal for characterizing small-vessel narrowing spurred)
the celiac trunk appears patent with some mild poststenotic
dilatation and the SMA and IMA appear patent.

Reproductive: Uterus absent.  Adnexa unremarkable.

Other: No supplemental non-categorized findings.

Musculoskeletal: Grade 1 degenerative anterolisthesis at L4-5
IMPRESSION: 1. Chronic mild right ileocolic nodal prominence, with some areas of
borderline bowel dilatation in distal loops of small bowel along
with areas of narrowing. I note that the patient has had similar
findings in the past leading to workups for inflammatory bowel
disease which were presumably otherwise negative, and the appearance
may be incidental but chronic low grade distal enteritis or terminal
ileitis could have a similar appearance.
2. Descending and sigmoid colon diverticulosis without active
diverticulitis.
3. There is some atherosclerosis along with narrowing of the
proximal celiac trunk, but no occlusion in the SMA and IMA appear
patent.
4. A small cystic lesion of the pancreatic tail is unchanged over 10
years and considered benign.

## 2023-11-22 DIAGNOSIS — C801 Malignant (primary) neoplasm, unspecified: Secondary | ICD-10-CM

## 2023-11-22 HISTORY — DX: Malignant (primary) neoplasm, unspecified: C80.1

## 2023-11-27 ENCOUNTER — Encounter: Payer: Self-pay | Admitting: *Deleted

## 2023-11-27 ENCOUNTER — Telehealth: Payer: Self-pay | Admitting: *Deleted

## 2023-11-27 NOTE — Telephone Encounter (Signed)
 Confirmed [facility] for 12/05/23 at 12:15pm .  Instructions and contact information given.

## 2023-12-03 ENCOUNTER — Encounter: Payer: Self-pay | Admitting: *Deleted

## 2023-12-03 DIAGNOSIS — Z170 Estrogen receptor positive status [ER+]: Secondary | ICD-10-CM | POA: Insufficient documentation

## 2023-12-05 ENCOUNTER — Ambulatory Visit
Admission: RE | Admit: 2023-12-05 | Discharge: 2023-12-05 | Disposition: A | Discharge status: Home / Self Care | Source: Ambulatory Visit | Attending: Radiation Oncology | Admitting: Radiation Oncology

## 2023-12-05 ENCOUNTER — Ambulatory Visit: Payer: Self-pay | Admitting: General Surgery

## 2023-12-05 ENCOUNTER — Encounter: Payer: Self-pay | Admitting: General Practice

## 2023-12-05 ENCOUNTER — Encounter: Payer: Self-pay | Admitting: *Deleted

## 2023-12-05 ENCOUNTER — Other Ambulatory Visit: Payer: Self-pay

## 2023-12-05 ENCOUNTER — Inpatient Hospital Stay

## 2023-12-05 ENCOUNTER — Ambulatory Visit: Attending: General Surgery | Admitting: Physical Therapy

## 2023-12-05 ENCOUNTER — Encounter: Payer: Self-pay | Admitting: Hematology

## 2023-12-05 ENCOUNTER — Encounter: Payer: Self-pay | Admitting: Physical Therapy

## 2023-12-05 ENCOUNTER — Other Ambulatory Visit: Payer: Self-pay | Admitting: *Deleted

## 2023-12-05 ENCOUNTER — Inpatient Hospital Stay: Attending: Hematology | Admitting: Hematology

## 2023-12-05 ENCOUNTER — Telehealth: Payer: Self-pay | Admitting: Genetic Counselor

## 2023-12-05 VITALS — BP 150/80 | HR 83 | Temp 98.1°F | Resp 21 | Wt 143.5 lb

## 2023-12-05 DIAGNOSIS — C50411 Malignant neoplasm of upper-outer quadrant of right female breast: Secondary | ICD-10-CM

## 2023-12-05 DIAGNOSIS — R293 Abnormal posture: Secondary | ICD-10-CM | POA: Diagnosis present

## 2023-12-05 DIAGNOSIS — Z1732 Human epidermal growth factor receptor 2 negative status: Secondary | ICD-10-CM

## 2023-12-05 DIAGNOSIS — E119 Type 2 diabetes mellitus without complications: Secondary | ICD-10-CM | POA: Diagnosis not present

## 2023-12-05 DIAGNOSIS — Z1731 Human epidermal growth factor receptor 2 positive status: Secondary | ICD-10-CM | POA: Insufficient documentation

## 2023-12-05 DIAGNOSIS — Z1722 Progesterone receptor negative status: Secondary | ICD-10-CM | POA: Insufficient documentation

## 2023-12-05 DIAGNOSIS — Z170 Estrogen receptor positive status [ER+]: Secondary | ICD-10-CM | POA: Diagnosis present

## 2023-12-05 DIAGNOSIS — Z803 Family history of malignant neoplasm of breast: Secondary | ICD-10-CM | POA: Diagnosis not present

## 2023-12-05 DIAGNOSIS — L438 Other lichen planus: Secondary | ICD-10-CM | POA: Insufficient documentation

## 2023-12-05 LAB — CBC WITH DIFFERENTIAL (CANCER CENTER ONLY)
Abs Immature Granulocytes: 0.02 10*3/uL (ref 0.00–0.07)
Basophils Absolute: 0.1 10*3/uL (ref 0.0–0.1)
Basophils Relative: 1 %
Eosinophils Absolute: 0 10*3/uL (ref 0.0–0.5)
Eosinophils Relative: 1 %
HCT: 35.6 % — ABNORMAL LOW (ref 36.0–46.0)
Hemoglobin: 12.1 g/dL (ref 12.0–15.0)
Immature Granulocytes: 0 %
Lymphocytes Relative: 25 %
Lymphs Abs: 1.9 10*3/uL (ref 0.7–4.0)
MCH: 28.3 pg (ref 26.0–34.0)
MCHC: 34 g/dL (ref 30.0–36.0)
MCV: 83.4 fL (ref 80.0–100.0)
Monocytes Absolute: 0.7 10*3/uL (ref 0.1–1.0)
Monocytes Relative: 9 %
Neutro Abs: 4.7 10*3/uL (ref 1.7–7.7)
Neutrophils Relative %: 64 %
Platelet Count: 178 10*3/uL (ref 150–400)
RBC: 4.27 MIL/uL (ref 3.87–5.11)
RDW: 14.1 % (ref 11.5–15.5)
WBC Count: 7.4 10*3/uL (ref 4.0–10.5)
nRBC: 0 % (ref 0.0–0.2)

## 2023-12-05 LAB — CMP (CANCER CENTER ONLY)
ALT: 10 U/L (ref 0–44)
AST: 14 U/L — ABNORMAL LOW (ref 15–41)
Albumin: 4.1 g/dL (ref 3.5–5.0)
Alkaline Phosphatase: 58 U/L (ref 38–126)
Anion gap: 5 (ref 5–15)
BUN: 9 mg/dL (ref 8–23)
CO2: 30 mmol/L (ref 22–32)
Calcium: 9.1 mg/dL (ref 8.9–10.3)
Chloride: 101 mmol/L (ref 98–111)
Creatinine: 0.6 mg/dL (ref 0.44–1.00)
GFR, Estimated: >60 mL/min (ref >60)
Glucose, Bld: 141 mg/dL — ABNORMAL HIGH (ref 70–99)
Potassium: 3.8 mmol/L (ref 3.5–5.1)
Sodium: 136 mmol/L (ref 135–145)
Total Bilirubin: 0.4 mg/dL (ref 0.0–1.2)
Total Protein: 6.5 g/dL (ref 6.5–8.1)

## 2023-12-05 LAB — GENETIC SCREENING ORDER

## 2023-12-05 NOTE — Progress Notes (Incomplete)
 Radiation Oncology         [phone] ________________________________  Multidisciplinary Breast Oncology Clinic [facility]) Initial Outpatient Consultation  Name: [name] MRN: [id number]  Date: 12/05/2023  DOB: 1941/01/15  WU:[id number], Rosalita Combe, NP  Caralyn Chandler, MD   REFERRING PHYSICIAN: Lillette Reid III, MD  DIAGNOSIS: There were no encounter diagnoses.  Stage IIA (cT2, cN0, cM0) Right Breast UOQ, Invasive mammary carcinoma with lobular features, ER+ / PR- / Her2+, Grade 2  No diagnosis found.  HISTORY OF PRESENT ILLNESS::[name] is a 83 y.o. female who is presenting to the office today for evaluation of her newly diagnosed breast cancer. She is accompanied by husband and daughter. She is doing well overall.   She initially presented for a bilateral diagnostic mammogram on 11/20/23 which demonstrated a 2.4 cm irregular density mass with associated calcifications in the upper outer right breast at a posterior depth, concerning for malignancy. She then underwent right breast ultrasonography at [facility] that same date (11/20/23) which showed a 2.3 cm irregular mass in the 10 o'clock right breast located 7 cmfn, correlating with mammographic findings.   Biopsy of the 10 o'clock right breast at 7 cmfn on 11/26/23 showed: grade 2 invasive mammary carcinoma with lobular features. Prognostic indicators significant for: estrogen receptor, 15% positive with moderate-strong staining intensity and progesterone receptor, 0% negative. Proliferation marker Ki67 at 10%. HER2 positive.  Menarche: 83 years old Age at first live birth: 83 years old GP: 3 LMP: does not remember LMP date Contraceptive: never used HRT: did not indicate use on the provided form    The patient was referred today for presentation in the multidisciplinary conference.  Radiology studies and pathology slides were presented there for review and discussion of treatment options.  A consensus was discussed regarding  potential next steps.  PREVIOUS RADIATION THERAPY: No  PAST MEDICAL HISTORY:  Past Medical History:  Diagnosis Date   Chest discomfort    Depression    Diabetes mellitus without complication (HCC)    Diverticulosis    Dyspnea    Fatigue    Gastritis    History of bronchitis    History of thrombocytopenia    related to medications   HLD (hyperlipidemia)    HTN (hypertension)    IBS (irritable bowel syndrome)    Lichen planus    mouth, feet, hands   Osteoarthritis     PAST SURGICAL HISTORY: Past Surgical History:  Procedure Laterality Date   ABDOMINAL HYSTERECTOMY     APPENDECTOMY     CATARACT EXTRACTION, BILATERAL     CHOLECYSTECTOMY     CT Virtual Colonscopy   10/29/2014   ESOPHAGOGASTRODUODENOSCOPY  05/10/2011   Moderate gastritis. Otherwise normal EGD   hysterectomy - unknown type     knee surgery Bilateral    laporoscopic   MOHS SURGERY     Basal Cell Carcinoma- right cheek    FAMILY HISTORY:  Family History  Problem Relation Age of Onset   Other Mother        accident   Other Father    Heart disease Sister    Heart disease Brother    Coronary artery disease Other    Cancer Daughter 84       breast cancer    SOCIAL HISTORY:  Social History   Socioeconomic History   Marital status: Married    Spouse name: Not on file   Number of children: 3   Years of education: Not on file  Highest education level: Not on file  Occupational History   Occupation: retired  Tobacco Use   Smoking status: Never   Smokeless tobacco: Never  Vaping Use   Vaping status: Never Used  Substance and Sexual Activity   Alcohol use: No   Drug use: No   Sexual activity: Not on file  Other Topics Concern   Not on file  Social History Narrative   Not on file   Social Drivers of Health   Financial Resource Strain: Not on file  Food Insecurity: Not on file  Transportation Needs: Not on file  Physical Activity: Not on file  Stress: Not on file  Social Connections:  Not on file    ALLERGIES:  Allergies  Allergen Reactions   Celebrex [Celecoxib]     Dizziness    Codeine    Morphine And Codeine    Tramadol Nausea And Vomiting    MEDICATIONS:  Current Outpatient Medications  Medication Sig Dispense Refill   Beta Carotene (VITAMIN A) 25000 UNIT capsule Take 25,000 Units by mouth every other day.     calcium carbonate 200 MG capsule Take 250 mg by mouth every other day.       Cholecalciferol (VITAMIN D3) 3000 UNITS TABS Take 5,000 Units by mouth every other day.      cyanocobalamin  500 MCG tablet Take 5,000 mcg by mouth every other day.      levothyroxine (SYNTHROID) 25 MCG tablet Take 25 mcg by mouth daily.     lovastatin (MEVACOR) 20 MG tablet Take 20 mg by mouth at bedtime.       Multiple Vitamin (MULTIVITAMIN) tablet Take 1 tablet by mouth every other day.      vitamin C (ASCORBIC ACID) 250 MG tablet Take 250 mg by mouth every other day.     vitamin E 180 MG (400 UNITS) capsule Take 400 Units by mouth every other day.     zinc gluconate 50 MG tablet Take 50 mg by mouth every other day.       No current facility-administered medications for this encounter.    REVIEW OF SYSTEMS: A 10+ POINT REVIEW OF SYSTEMS WAS OBTAINED including neurology, dermatology, psychiatry, cardiac, respiratory, lymph, extremities, GI, GU, musculoskeletal, constitutional, reproductive, HEENT. On the provided form, she reports fatigue that does effect her activities, loss of sleep, wearing glasses, hearing aid use, ringing in ears, dental problems, wearing dentures, mouth sores, abdominal pain, breast lump and breast pain (specifically reports having sharp breast pain today), back pain, joint pain, arthritis, headaches, weakness, anxiety, depression, diabetes, thyroid  issues, and a history of an immune disorder. She denies any other symptoms.    PHYSICAL EXAM:     12/05/2023  Vitals with BMI   Height   Weight 143 lbs 8 oz   BMI   Systolic 150 !   Systolic 181 !    Diastolic 80 !   Diastolic 86 !   Pulse 83     Legend: ! Abnormal  Lungs are clear to auscultation bilaterally. Heart has regular rate and rhythm. No palpable cervical, supraclavicular, or axillary adenopathy. Abdomen soft, non-tender, normal bowel sounds. Breast: Left breast with no palpable mass, nipple discharge, or bleeding. Right breast with extensive bruising in the lateral aspect of the breast with a palpable mass measuring 2.5 - 3 cm in size.   KPS = 90  100 - Normal; no complaints; no evidence of disease. 90   - Able to carry on normal activity; minor signs or symptoms  of disease. 80   - Normal activity with effort; some signs or symptoms of disease. 11   - Cares for self; unable to carry on normal activity or to do active work. 60   - Requires occasional assistance, but is able to care for most of his personal needs. 50   - Requires considerable assistance and frequent medical care. 40   - Disabled; requires special care and assistance. 30   - Severely disabled; hospital admission is indicated although death not imminent. 20   - Very sick; hospital admission necessary; active supportive treatment necessary. 10   - Moribund; fatal processes progressing rapidly. 0     - Dead  Karnofsky DA, Abelmann WH, Craver LS and Burchenal [facility] [phone]) The use of the nitrogen mustards in the palliative treatment of carcinoma: with particular reference to bronchogenic carcinoma Cancer 1 634-56  LABORATORY DATA:  Lab Results  Component Value Date   WBC 7.4 12/05/2023   HGB 12.1 12/05/2023   HCT 35.6 (L) 12/05/2023   MCV 83.4 12/05/2023   PLT 178 12/05/2023   Lab Results  Component Value Date   NA 136 12/05/2023   K 3.8 12/05/2023   CL 101 12/05/2023   CO2 30 12/05/2023   Lab Results  Component Value Date   ALT 10 12/05/2023   AST 14 (L) 12/05/2023   ALKPHOS 58 12/05/2023   BILITOT 0.4 12/05/2023    PULMONARY FUNCTION TEST:   Review Flowsheet        No data to display           RADIOGRAPHY: No results found.    IMPRESSION: Stage IIA (cT2, cN0, cM0) Right Breast UOQ, Invasive mammary carcinoma with lobular features, ER+ / PR- / Her2+, Grade 2  Patient will be a good candidate for breast conservation with radiotherapy to the right breast. We discussed the general course of radiation, potential side effects, and toxicities with radiation and the patient is interested in this approach.   Patient lives in [place] and she would like to receive her radiation therapy at the [facility]. Her daughter received her radiation therapy at that facility.   PLAN:  Genetics  MRI Right breast lumpectomy with SLN evaluation (repeat prognostics will be performed on final surgical path) Possibly adjuvant chemotherapy  Adjuvant radiation therapy  Aromatase inhibitor    ------------------------------------------------  Noralee Beam, PhD, MD  This document serves as a record of services personally performed by Retta Caster, MD. It was created on his behalf by Aleta Anda, a trained medical scribe. The creation of this record is based on the scribe's personal observations and the provider's statements to them. This document has been checked and approved by the attending provider.

## 2023-12-05 NOTE — Telephone Encounter (Addendum)
 [name] was seen by a genetic counselor during the breast multidisciplinary clinic on Dec 05, 2023. In addition to her personal history of breast cancer, she reported a family history of breast cancer in her daughter at age 84. No other history of cancer was reported. She does not meet NCCN criteria for genetic testing at this time.   She was still offered genetic counseling and testing but declined. We encourage her to contact us  if there are any changes to her personal or family history of cancer. If she meets NCCN criteria based on the updated personal/family history, she would be recommended to have genetic counseling and testing.

## 2023-12-05 NOTE — Progress Notes (Signed)
 [facility] Multidisciplinary Clinic Spiritual Care Note  Met with [name], her husband, and her daughter in Breast Multidisciplinary Clinic to introduce Support Center team/resources.  She completed SDOH screening; results follow below.    SDOH Screenings   Food Insecurity: No Food Insecurity (12/05/2023)  Housing: Low Risk  (12/05/2023)  Transportation Needs: No Transportation Needs (12/05/2023)  Utilities: Not At Risk (12/05/2023)  Depression (PHQ2-9): Low Risk  (12/05/2023)  Tobacco Use: Low Risk  (12/05/2023)   Chaplain and patient discussed common feelings and emotions when being diagnosed with cancer, and the importance of support during treatment.  Chaplain informed patient of the support team and support services at [facility].  Chaplain provided contact information and encouraged patient to call with any questions or concerns.  [name] reports a significant grief history, starting with her parents when she was 70 years old. Her family has coped with several losses and significant illnesses in the 2020s, as well.  Follow up needed: Yes.  We plan to connect by phone in 1-2 weeks to speak in more detail.   [address] [name], [place], [facility] Pager [phone] Voicemail [phone]

## 2023-12-05 NOTE — Research (Signed)
 [employer] 2021-05 - Specimen Collection Study to Evaluate Biomarkers in Subjects with Cancer     Patient [name] was identified by Dr. Maryalice Smaller as a potential candidate for the above listed study.  This Clinical Research Nurse met with EDOM MUNOS, [id number], on 12/05/23 in a manner and location that ensures patient privacy to discuss participation in the above listed research study.  Patient is Accompanied by her spouse and daughter.  A copy of the informed consent document with embedded HIPAA language was provided to the patient.  Patient reads, speaks, and understands [place].   Patient was provided with the business card of this Nurse and encouraged to contact the research team with any questions.  Approximately 10 minutes were spent with the patient reviewing the informed consent documents.  Patient was provided the option of taking informed consent documents home to review and was encouraged to review at their convenience with their support network, including other care providers. Patient took the consent documents home to review. Patient is interested in study participation.  She reports a history of basal cell carcinoma of the skin that was removed from her face in 2018.  She reports this was removed by Dr. Lilyan [facility] in [place], [place].  No other cancer history.  A member of the research team will follow up with patient.  Dagmar Drones, RN, BSN, [facility] 12/05/2023 4:18 PM

## 2023-12-05 NOTE — Therapy (Signed)
 OUTPATIENT PHYSICAL THERAPY BREAST CANCER BASELINE EVALUATION   Patient Name: [name] MRN: [id number] DOB:1941-07-09, 83 y.o., female Today's Date: 12/05/2023  END OF SESSION:  PT End of Session - 12/05/23 2040     Visit Number 1    Number of Visits 2    Date for PT Re-Evaluation 01/30/24    PT Start Time 1409    PT Stop Time 1422   Also saw pt from [phone] for a total of 30 min   PT Time Calculation (min) 13 min    Activity Tolerance Patient tolerated treatment well    Behavior During Therapy WFL for tasks assessed/performed             Past Medical History:  Diagnosis Date   Chest discomfort    Depression    Diabetes mellitus without complication (HCC)    Diverticulosis    Dyspnea    Fatigue    Gastritis    History of bronchitis    History of thrombocytopenia    related to medications   HLD (hyperlipidemia)    HTN (hypertension)    IBS (irritable bowel syndrome)    Lichen planus    mouth, feet, hands   Osteoarthritis    Past Surgical History:  Procedure Laterality Date   ABDOMINAL HYSTERECTOMY     APPENDECTOMY     CATARACT EXTRACTION, BILATERAL     CHOLECYSTECTOMY     CT Virtual Colonscopy   10/29/2014   ESOPHAGOGASTRODUODENOSCOPY  05/10/2011   Moderate gastritis. Otherwise normal EGD   hysterectomy - unknown type     knee surgery Bilateral    laporoscopic   MOHS SURGERY     Basal Cell Carcinoma- right cheek   Patient Active Problem List   Diagnosis Date Noted   Malignant neoplasm of upper-outer quadrant of right breast in female, estrogen receptor positive (HCC) 12/03/2023    REFERRING PROVIDER: Dr. Lillette Reid  REFERRING DIAG: Right breast cancer  THERAPY DIAG:  Malignant neoplasm of upper-outer quadrant of right breast in female, estrogen receptor positive (HCC)  Abnormal posture  Rationale for Evaluation and Treatment: Rehabilitation  ONSET DATE: 11/20/2023  SUBJECTIVE:                                                                                                                                                                                            SUBJECTIVE STATEMENT: Patient reports she is here today to be seen by her medical team for her newly diagnosed right breast cancer.   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 It measures 2.3 cm  and is located in the upper outer quadrant. It is ER positive, PR negative, and HER2 positive with a Ki67 of 10%.   PATIENT GOALS:   reduce lymphedema risk and learn post op HEP.   PAIN:  Are you having pain? No  PRECAUTIONS: Active CA   RED FLAGS: None   HAND DOMINANCE: right  WEIGHT BEARING RESTRICTIONS: No  FALLS:  Has patient fallen in last 6 months? No  LIVING ENVIRONMENT: Patient lives with: her husband and 56 and 34 y.o. grandkids Lives in: House/apartment Has following equipment at home: None  OCCUPATION: retired  LEISURE: She does not exercise but is very active  PRIOR LEVEL OF FUNCTION: Independent   OBJECTIVE: Note: Objective measures were completed at Evaluation unless otherwise noted.  COGNITION: Overall cognitive status: Within functional limits for tasks assessed    POSTURE:  Forward head and rounded shoulders posture  UPPER EXTREMITY AROM/PROM:  A/PROM RIGHT   eval   Shoulder extension 52  Shoulder flexion 141  Shoulder abduction 158  Shoulder internal rotation 64  Shoulder external rotation 90    (Blank rows = not tested)  A/PROM LEFT   eval  Shoulder extension 51  Shoulder flexion 127  Shoulder abduction 146  Shoulder internal rotation 60  Shoulder external rotation 85    (Blank rows = not tested)  CERVICAL AROM:   Percent limited  Flexion WNL  Extension WNL  Right lateral flexion 75% limited  Left lateral flexion WNL  Right rotation WNL  Left rotation 75% limited    UPPER EXTREMITY STRENGTH: WFL  LYMPHEDEMA ASSESSMENTS (in cm):    LANDMARK RIGHT   eval  10 cm proximal to olecranon process 24.5  Olecranon process 21.2  10 cm proximal to ulnar styloid process 20.1  Just proximal to ulnar styloid process 15.2  Across hand at thumb web space 20.2  At base of 2nd digit 6.3  (Blank rows = not tested)  LANDMARK LEFT   eval  10 cm proximal to olecranon process 24.2  Olecranon process 21.5  10 cm proximal to ulnar styloid process 19.1  Just proximal to ulnar styloid process 14.8  Across hand at thumb web space 19.9  At base of 2nd digit 6  (Blank rows = not tested)  L-DEX LYMPHEDEMA SCREENING:  The patient was assessed using the L-Dex machine today to produce a lymphedema index baseline score. The patient will be reassessed on a regular basis (typically every 3 months) to obtain new L-Dex scores. If the score is > 6.5 points away from his/her baseline score indicating onset of subclinical lymphedema, it will be recommended to wear a compression garment for 4 weeks, 12 hours per day and then be reassessed. If the score continues to be > 6.5 points from baseline at reassessment, we will initiate lymphedema treatment. Assessing in this manner has a 95% rate of preventing clinically significant lymphedema.   L-DEX FLOWSHEETS - 12/05/23 2000       L-DEX LYMPHEDEMA SCREENING   Measurement Type Unilateral    L-DEX MEASUREMENT EXTREMITY Upper Extremity    POSITION  Standing    DOMINANT SIDE Right    At Risk Side Right    BASELINE SCORE (UNILATERAL) -3.5             QUICK DASH SURVEY:  Cindia Crease - 12/05/23 0001     Open a tight or new jar Moderate difficulty    Do heavy household chores (wash walls, wash floors) Moderate difficulty    Carry  a shopping bag or briefcase Moderate difficulty    Wash your back No difficulty    Use a knife to cut food No difficulty    Recreational activities in which you take some force or impact through your arm, shoulder, or hand (golf, hammering, tennis) Severe difficulty     During the past week, to what extent has your arm, shoulder or hand problem interfered with your normal social activities with family, friends, neighbors, or groups? Modererately    During the past week, to what extent has your arm, shoulder or hand problem limited your work or other regular daily activities Quite a bit    Arm, shoulder, or hand pain. Moderate    Tingling (pins and needles) in your arm, shoulder, or hand None    Difficulty Sleeping Severe difficulty    DASH Score 43.18 %              PATIENT EDUCATION:  Education details: Time spent educating patient on aspects of self-care to maximize post op recovery. Patient was educated on where and how to get a post op compression bra to use to reduce post op edema. Patient was also educated on the use of SOZO screenings and surveillance principles for early identification of lymphedema onset. She was instructed to use the post op pillow in the axilla for pressure and pain relief. Patient educated on lymphedema risk reduction and post op shoulder/posture HEP. Person educated: Patient Education method: Explanation, Demonstration, Handout Education comprehension: Patient verbalized understanding and returned demonstration  HOME EXERCISE PROGRAM: Patient was instructed today in a home exercise program today for post op shoulder range of motion. These included active assist shoulder flexion in sitting, scapular retraction, wall walking with shoulder abduction, and hands behind head external rotation.  She was encouraged to do these twice a day, holding 3 seconds and repeating 5 times when permitted by her physician.   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 It measures 2.3 cm and is located in the upper outer quadrant. It is ER positive, PR negative, and HER2 positive with a Ki67 of 10%.  Her multidisciplinary medical team met prior to her  assessments to determine a recommended treatment plan. She is planning to have a right lumpectomy and sentinel node biopsy followed by possible chemotherapy, radiation, and anti-estrogen therapy. She will benefit from a post op PT reassessment to determine needs and from L-Dex screens every 3 months for 2 years to detect subclinical lymphedema.  Pt will benefit from skilled therapeutic intervention to improve on the following deficits: Decreased knowledge of precautions, impaired UE functional use, pain, decreased ROM, postural dysfunction.   PT treatment/interventions: ADL/self-care home management, pt/family education, therapeutic exercise  REHAB POTENTIAL: Excellent  CLINICAL DECISION MAKING: Stable/uncomplicated  EVALUATION COMPLEXITY: Low   GOALS: Goals reviewed with patient? YES  LONG TERM GOALS: (STG=LTG)    Name Target Date Goal status  1 Pt will be able to verbalize understanding of pertinent lymphedema risk reduction practices relevant to her dx specifically related to skin care.  Baseline:  No knowledge 12/05/2023 Achieved at eval  2 Pt will be able to return demo and/or verbalize understanding of the post op HEP related to regaining shoulder ROM. Baseline:  No knowledge 12/05/2023 Achieved at eval  3 Pt will be able to verbalize understanding of the importance of viewing the post op After Breast CA Class video for further lymphedema risk reduction education and therapeutic exercise.  Baseline:  No knowledge 12/05/2023 Achieved at eval  4 Pt will demo she has regained full shoulder ROM and function post operatively compared to baselines.  Baseline: See objective measurements taken today. 01/30/2024     PLAN:  PT FREQUENCY/DURATION: EVAL and 1 follow up appointment.   PLAN FOR NEXT SESSION: will reassess 3-4 weeks post op to determine needs.   Patient will follow up at outpatient cancer rehab 3-4 weeks following surgery.  If the patient requires physical therapy at that time,  a specific plan will be dictated and sent to the referring physician for approval. The patient was educated today on appropriate basic range of motion exercises to begin post operatively and the importance of viewing the After Breast Cancer class video following surgery.  Patient was educated today on lymphedema risk reduction practices as it pertains to recommendations that will benefit the patient immediately following surgery.  She verbalized good understanding.    Physical Therapy Information for After Breast Cancer Surgery/Treatment:  Lymphedema is a swelling condition that you may be at risk for in your arm if you have lymph nodes removed from the armpit area.  After a sentinel node biopsy, the risk is approximately 5-9% and is higher after an axillary node dissection.  There is treatment available for this condition and it is not life-threatening.  Contact your physician or physical therapist with concerns. You may begin the 4 shoulder/posture exercises (see additional sheet) when permitted by your physician (typically a week after surgery).  If you have drains, you may need to wait until those are removed before beginning range of motion exercises.  A general recommendation is to not lift your arms above shoulder height until drains are removed.  These exercises should be done to your tolerance and gently.  This is not a "no pain/no gain" type of recovery so listen to your body and stretch into the range of motion that you can tolerate, stopping if you have pain.  If you are having immediate reconstruction, ask your plastic surgeon about doing exercises as he or she may want you to wait. We encourage you to view the After Breast Cancer class video following surgery.  You will learn information related to lymphedema risk, prevention and treatment and additional exercises to regain mobility following surgery.   While undergoing any medical procedure or treatment, try to avoid blood pressure being taken  or needle sticks from occurring on the arm on the side of cancer.   This recommendation begins after surgery and continues for the rest of your life.  This may help reduce your risk of getting lymphedema (swelling in your arm). An excellent resource for those seeking information on lymphedema is the National [employer] Network's web site. It can be accessed at www.lymphnet.org If you notice swelling in your hand, arm or breast at any time following surgery (even if it is many years from now), please contact your doctor or physical therapist to discuss this.  Lymphedema can be treated at any time but it is easier for you if it is treated early on.  If you feel like your shoulder motion is not returning to normal in a reasonable amount of time, please contact your surgeon or physical therapist.  [facility] [phone]. [address], [place] [address]  [facility] After Breast Cancer Class  After Breast Cancer Class is a specially designed exercise class video to assist you in a safe recover after having breast cancer surgery.  In this video you will learn how to get back to full function whether your drains were just removed or if you had surgery a month ago. The video can be viewed on this page: [email or web address].boyd-meyer.org/ or on YouTube here: https://[email or web address].  Class Goals  Understand specific stretches to improve the flexibility of you chest and shoulder. Learn ways to safely strengthen your upper body and improve your posture. Understand the warning signs of infection and why you may be at risk for an arm infection. Learn about Lymphedema and prevention.  ** You do not need to view this video until after surgery.  Drains should be removed to participate in the recommended exercises on the video.  Patient was instructed today in a home exercise program today for post op shoulder  range of motion. These included active assist shoulder flexion in sitting, scapular retraction, wall walking with shoulder abduction, and hands behind head external rotation.  She was encouraged to do these twice a day, holding 3 seconds and repeating 5 times when permitted by her physician.  Rollin Clock,  12/05/23 8:50 PM

## 2023-12-06 ENCOUNTER — Encounter: Payer: Self-pay | Admitting: Internal Medicine

## 2023-12-06 ENCOUNTER — Encounter: Payer: Self-pay | Admitting: Hematology

## 2023-12-06 NOTE — Addendum Note (Signed)
 Encounter addended by: Retta Caster, MD on: 12/06/2023 10:09 AM  Actions taken: Clinical Note Signed

## 2023-12-06 NOTE — Progress Notes (Addendum)
 [facility]   [phone] [phone]   Clinic New Consult Note   Patient Care Team: Suzon Ester, NP as PCP - General (Nurse Practitioner) Auther Bo, RN as Oncology Nurse Navigator Alane Hsu, RN as Oncology Nurse Navigator Sonja Margaret[place], MD as Consulting Physician (Hematology) Caralyn Chandler, MD as Consulting Physician (General Surgery) Retta Caster, MD as Consulting Physician (Radiation Oncology) 12/06/2023  CHIEF COMPLAINTS/PURPOSE OF CONSULTATION:  Newly diagnosed breast cancer   REFERRING PHYSICIAN:   Discussed the use of AI scribe software for clinical note transcription with the patient, who gave verbal consent to proceed.  History of Present Illness [name] is an 83 year old female with breast cancer who presents to our multidisciplinary breast clinic for a new consult.  She is accompanied by her husband and daughter.  She initially experienced bilateral breast pain two weeks ago, which she thought was due to muscle strain. The pain was shooting, leading her to find a lump in her right breast.  She underwent mammogram and ultrasound which showed a 2.3 x 2.1 cm mass in the upper outer quadrant of right breast, 4 cm from nipple.  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  A biopsy caused significant bruising and pain, described as severe. Pain radiated down her arm from the wrist but resolved a few days post-biopsy. There are no skin changes, nipple discharge, cough, or chest discomfort. She reports no new pain or discomfort before the biopsy.  Her medical history includes lichen planus, diabetes managed by diet, and depression. She takes vitamin D, B12, thyroid , and cholesterol medications. Surgical history includes appendectomy, cholecystectomy, cataract surgery, and hysterectomy with oophorectomy.  Her family history is notable for her  daughter, diagnosed with breast cancer at age 22, currently on medication.  She lives with her husband and feels weak and inactive, spending most of her time in a recliner. She avoids lifting due to wrist pain but continues some house chores and drives with her husband's accompaniment.     MEDICAL HISTORY:  Past Medical History:  Diagnosis Date   Chest discomfort    Depression    Diabetes mellitus without complication (HCC)    Diverticulosis    Dyspnea    Fatigue    Gastritis    History of bronchitis    History of thrombocytopenia    related to medications   HLD (hyperlipidemia)    HTN (hypertension)    IBS (irritable bowel syndrome)    Lichen planus    mouth, feet, hands   Osteoarthritis     SURGICAL HISTORY: Past Surgical History:  Procedure Laterality Date   ABDOMINAL HYSTERECTOMY     APPENDECTOMY     CATARACT EXTRACTION, BILATERAL     CHOLECYSTECTOMY     CT Virtual Colonscopy   10/29/2014   ESOPHAGOGASTRODUODENOSCOPY  05/10/2011   Moderate gastritis. Otherwise normal EGD   hysterectomy - unknown type     knee surgery Bilateral    laporoscopic   MOHS SURGERY     Basal Cell Carcinoma- right cheek    SOCIAL HISTORY: Social History   Socioeconomic History   Marital status: Married    Spouse name: Not on file   Number of children: 3   Years of education: Not on file   Highest education level: Not on file  Occupational History   Occupation: retired  Tobacco Use   Smoking status: Never  Smokeless tobacco: Never  Vaping Use   Vaping status: Never Used  Substance and Sexual Activity   Alcohol use: No   Drug use: No   Sexual activity: Not on file  Other Topics Concern   Not on file  Social History Narrative   Not on file   Social Drivers of Health   Financial Resource Strain: Not on file  Food Insecurity: No Food Insecurity (12/05/2023)   Hunger Vital Sign    Worried About Running Out of Food in the Last Year: Never true    Ran Out of Food in the  Last Year: Never true  Transportation Needs: No Transportation Needs (12/05/2023)   PRAPARE - Administrator, Civil Service (Medical): No    Lack of Transportation (Non-Medical): No  Physical Activity: Not on file  Stress: Not on file  Social Connections: Not on file  Intimate Partner Violence: Not At Risk (12/05/2023)   Humiliation, Afraid, Rape, and Kick questionnaire    Fear of Current or Ex-Partner: No    Emotionally Abused: No    Physically Abused: No    Sexually Abused: No    FAMILY HISTORY: Family History  Problem Relation Age of Onset   Other Mother        accident   Other Father    Heart disease Sister    Heart disease Brother    Coronary artery disease Other    Cancer Daughter 45       breast cancer    ALLERGIES:  is allergic to celebrex [celecoxib], codeine, morphine and codeine, and tramadol.  MEDICATIONS:  Current Outpatient Medications  Medication Sig Dispense Refill   Beta Carotene (VITAMIN A) 25000 UNIT capsule Take 25,000 Units by mouth every other day.     calcium carbonate 200 MG capsule Take 250 mg by mouth every other day.       Cholecalciferol (VITAMIN D3) 3000 UNITS TABS Take 5,000 Units by mouth every other day.      cyanocobalamin  500 MCG tablet Take 5,000 mcg by mouth every other day.      levothyroxine (SYNTHROID) 25 MCG tablet Take 25 mcg by mouth daily.     lovastatin (MEVACOR) 20 MG tablet Take 20 mg by mouth at bedtime.       Multiple Vitamin (MULTIVITAMIN) tablet Take 1 tablet by mouth every other day.      vitamin C (ASCORBIC ACID) 250 MG tablet Take 250 mg by mouth every other day.     vitamin E 180 MG (400 UNITS) capsule Take 400 Units by mouth every other day.     zinc gluconate 50 MG tablet Take 50 mg by mouth every other day.       No current facility-administered medications for this visit.    REVIEW OF SYSTEMS:   Constitutional: Denies fevers, chills or abnormal night sweats Eyes: Denies blurriness of vision, double  vision or watery eyes Ears, nose, mouth, throat, and face: Denies mucositis or sore throat Respiratory: Denies cough, dyspnea or wheezes Cardiovascular: Denies palpitation, chest discomfort or lower extremity swelling Gastrointestinal:  Denies nausea, heartburn or change in bowel habits Skin: Denies abnormal skin rashes Lymphatics: Denies new lymphadenopathy or easy bruising Neurological:Denies numbness, tingling or new weaknesses Behavioral/Psych: Mood is stable, no new changes  All other systems were reviewed with the patient and are negative.  PHYSICAL EXAMINATION: ECOG PERFORMANCE STATUS: 2 - Symptomatic, <50% confined to bed  Vitals:   12/05/23 1251 12/05/23 1252  BP: (!) 181/86 Aaron Aas)  150/80  Pulse: 83   Resp: (!) 21   Temp: 98.1 F (36.7 C)   SpO2: 98%    Filed Weights   12/05/23 1251  Weight: 143 lb 8 oz (65.1 kg)    GENERAL:alert, no distress and comfortable SKIN: skin color, texture, turgor are normal, no rashes or significant lesions EYES: normal, conjunctiva are pink and non-injected, sclera clear OROPHARYNX:no exudate, no erythema and lips, buccal mucosa, and tongue normal  NECK: supple, thyroid  normal size, non-tender, without nodularity LYMPH:  no palpable lymphadenopathy in the cervical, axillary or inguinal LUNGS: clear to auscultation and percussion with normal breathing effort HEART: regular rate & rhythm and no murmurs and no lower extremity edema ABDOMEN:abdomen soft, non-tender and normal bowel sounds Musculoskeletal:no cyanosis of digits and no clubbing  PSYCH: alert & oriented x 3 with fluent speech NEURO: no focal motor/sensory deficits  Physical Exam BREAST: Right breast mass at 10 o'clock position, 2.5 x 3 cm, tender, bruised.  LABORATORY DATA:  I have reviewed the data as listed    Latest Ref Rng & Units 12/05/2023   12:12 PM 05/27/2021    3:36 PM 02/22/2018    2:18 PM  CBC  WBC 4.0 - 10.5 K/uL 7.4  8.5  6.5   Hemoglobin 12.0 - 15.0 g/dL 66.4   40.3  47.4   Hematocrit 36.0 - 46.0 % 35.6  41.5  40.2   Platelets 150 - 400 K/uL 178  223  179.0     @cmpl @  RADIOGRAPHIC STUDIES: I have personally reviewed the radiological images as listed and agreed with the findings in the report. No results found.  Assessment & Plan 83 year old female presented with palpable right breast mass  Malignant neoplasm of upper-outer quadrant of right breast, invasive lobular carcinoma, cT2N0M0, G2, ER+/PR-/HER2+, stage IIA Invasive lobular carcinoma of the right breast, diagnosed after a biopsy. The mass is located in the upper outer quadrant at the ten o'clock position, measuring approximately 2.4 x 1.4 cm. The tumor is HER2 positive, estrogen receptor weakly positive (15%), and progesterone receptor negative. Classified as stage IIA based on size and characteristics.  -Due to the lobular histology, we recommend breast MRI to assess tumor size and lymph node involvement - Patient was seen by breast surgeon Dr. Alethea Andes today, and recommends lumpectomy and sentinel lymph node biopsy. - I discussed the overall aggressiveness of HER2 positive, especially ER weakly positive disease, and the benefit of neoadjuvant or adjuvant chemotherapy including her2 antibodies.  Due to her advanced age and performance status, she is not a candidate for intensive chemo, but we can try single agent Taxol and trastuzumab.  If she has poor tolerance to weekly Taxol, I will change to every other week and shorten the duration of her treatment. We also discussed other option such as Kadcyla -if her breast MRI shows much large tumor or positive node(s), we may consider neoadjuvant Kadcyla or enhertu  - Patient was seen by radiation oncologist Dr. Eloise Hake today and reviewed adjuvant radiation   Diabetes mellitus, unspecified Diabetes mellitus managed through diet. She does not take medication for diabetes and attempts to control it through dietary measures.  Lichen planus Chronic lichen  planus affecting the scalp, mouth, fingernails, and big toe. She experiences skin, nail, and hair changes. The condition is not curable, contagious, or life-threatening but causes significant discomfort. She does not see a specialist for this condition and manages it with her primary care provider.  Plan - Imaging and biopsy results were reviewed with  patient and her family in detail. - Bilateral breast MRI with and without contrast in next few weeks - Plan to have right lumpectomy and sentinel lymph node biopsy soon - I will see her after surgery, to finalize her adjuvant systemic treatment. Will have port placed during her surgery    No orders of the defined types were placed in this encounter.   All questions were answered. The patient knows to call the clinic with any problems, questions or concerns. I spent 45 minutes counseling the patient face to face. The total time spent in the appointment was 60 minutes including review of chart and various tests results, discussions about plan of care and coordination of care plan.     Sonja Peru, MD 12/05/2023

## 2023-12-10 ENCOUNTER — Encounter: Payer: Self-pay | Admitting: General Practice

## 2023-12-10 NOTE — Progress Notes (Signed)
 [facility] Spiritual Care Note  Followed up with Ms Andy Bannister by phone after meeting at [facility]. Per Ms Iafrate, the biggest stressor that she is encountering right now is how to juggle all the appointments that she and her husband have for their various health needs (he is due for hip replacement soon).  She also reports that she much prefers to be the driver because she feels more comfortable behind the wheel than being the passenger, which makes her concerned about how she may navigate any healing periods or complications from treatment.  Ms Lindh has direct Spiritual Care number and plans to phone chaplain as needed/desired for follow-up support.   [address] Dorice Gardner, [place], [facility] Pager [phone] Voicemail [phone]

## 2023-12-13 ENCOUNTER — Telehealth: Payer: Self-pay | Admitting: *Deleted

## 2023-12-13 ENCOUNTER — Encounter: Payer: Self-pay | Admitting: *Deleted

## 2023-12-13 NOTE — Telephone Encounter (Signed)
 Attempted to call patient to follow up from [facility] 5/14 and assess navigation needs.  No answer and was unable to leave a VM.

## 2023-12-14 ENCOUNTER — Ambulatory Visit
Admission: RE | Admit: 2023-12-14 | Discharge: 2023-12-14 | Disposition: A | Discharge status: Home / Self Care | Source: Ambulatory Visit | Attending: Hematology | Admitting: Hematology

## 2023-12-14 DIAGNOSIS — Z170 Estrogen receptor positive status [ER+]: Secondary | ICD-10-CM

## 2023-12-14 MED ORDER — GADOPICLENOL 0.5 MMOL/ML IV SOLN
6.0000 mL | Freq: Once | INTRAVENOUS | Status: AC | PRN
  Administered 2023-12-14: 6 mL via INTRAVENOUS

## 2023-12-25 ENCOUNTER — Encounter: Payer: Self-pay | Admitting: *Deleted

## 2023-12-26 ENCOUNTER — Other Ambulatory Visit: Payer: Self-pay

## 2023-12-26 ENCOUNTER — Encounter (HOSPITAL_BASED_OUTPATIENT_CLINIC_OR_DEPARTMENT_OTHER)
Admission: RE | Admit: 2023-12-26 | Discharge: 2023-12-26 | Disposition: A | Discharge status: Home / Self Care | Source: Ambulatory Visit | Attending: General Surgery | Admitting: General Surgery

## 2023-12-26 ENCOUNTER — Encounter (HOSPITAL_BASED_OUTPATIENT_CLINIC_OR_DEPARTMENT_OTHER): Payer: Self-pay | Admitting: General Surgery

## 2023-12-26 DIAGNOSIS — Z01818 Encounter for other preprocedural examination: Secondary | ICD-10-CM | POA: Diagnosis present

## 2023-12-26 MED ORDER — CHLORHEXIDINE GLUCONATE CLOTH 2 % EX PADS: 6.0000 | MEDICATED_PAD | Freq: Once | CUTANEOUS | Status: DC

## 2023-12-26 NOTE — Progress Notes (Signed)
      Enhanced Recovery after Surgery  Enhanced Recovery after Surgery is a protocol used to improve the stress on your body and your recovery after surgery.  Patient Instructions  The night before surgery:  No food after midnight. ONLY clear liquids after midnight  The day of surgery (if you have diabetes): Drink ONE (1) Gatorade 2 (G2) as directed. This drink was given to you during your hospital  pre-op appointment visit.  The pre-op nurse will instruct you on the time to drink the   Gatorade 2 (G2) depending on your surgery time. Color of the Gatorade may vary. Red is not allowed. Nothing else to drink after completing the  Gatorade 2 (G2).         If you have questions, please contact your surgeon's office. Surgical soap given with instructions

## 2023-12-31 ENCOUNTER — Telehealth: Payer: Self-pay | Admitting: *Deleted

## 2023-12-31 NOTE — Telephone Encounter (Signed)
 [employer] 2021-05 - Specimen Collection Study to Evaluate Biomarkers in Subjects with Cancer    Followed up with pt this morning regarding interest in the above study. Pt stated she is not going to participate in the study because she has too much going on right now with her cancer, surgery, and her husband's health. Offered to answer any questions pt had. She stated she does not have any questions at this time and thanked us  for following up with her.   Awilda Bogus, RN, BSN Clinical Research Nurse [phone] 12/31/2023 9:39 AM

## 2024-01-02 NOTE — Anesthesia Preprocedure Evaluation (Addendum)
 Anesthesia Evaluation  Patient identified by MRN, date of birth, ID band Patient awake    Reviewed: Allergy & Precautions, NPO status , Patient's Chart, lab work & pertinent test results  Airway Mallampati: II  TM Distance: >3 FB     Dental  (+) Dental Advisory Given, Missing   Pulmonary shortness of breath and with exertion   Pulmonary exam normal breath sounds clear to auscultation       Cardiovascular hypertension, Normal cardiovascular exam Rhythm:Regular Rate:Normal  EKG 12/26/23 Normal sinus rhythm Minimal voltage criteria for LVH, may be normal variant ( R in aVL ) Septal infarct , age undetermined   Neuro/Psych  PSYCHIATRIC DISORDERS Anxiety Depression    negative neurological ROS     GI/Hepatic negative GI ROS, Neg liver ROS,,,IBS   Endo/Other  diabetes, Well Controlled, Type 2  Right Breast Ca HLD  Renal/GU negative Renal ROS  negative genitourinary   Musculoskeletal  (+) Arthritis , Osteoarthritis,    Abdominal   Peds  Hematology negative hematology ROS (+)   Anesthesia Other Findings   Reproductive/Obstetrics                             Anesthesia Physical Anesthesia Plan  ASA: 2  Anesthesia Plan: General   Post-op Pain Management: Regional block* and Minimal or no pain anticipated   Induction: Intravenous  PONV Risk Score and Plan: 4 or greater and Treatment may vary due to age or medical condition and Ondansetron  Airway Management Planned: LMA  Additional Equipment: None  Intra-op Plan:   Post-operative Plan: Extubation in OR  Informed Consent: I have reviewed the patients History and Physical, chart, labs and discussed the procedure including the risks, benefits and alternatives for the proposed anesthesia with the patient or authorized representative who has indicated his/her understanding and acceptance.       Plan Discussed with: CRNA and  Anesthesiologist  Anesthesia Plan Comments:         Anesthesia Quick Evaluation

## 2024-01-03 ENCOUNTER — Ambulatory Visit (HOSPITAL_BASED_OUTPATIENT_CLINIC_OR_DEPARTMENT_OTHER): Admitting: Anesthesiology

## 2024-01-03 ENCOUNTER — Ambulatory Visit (HOSPITAL_BASED_OUTPATIENT_CLINIC_OR_DEPARTMENT_OTHER)
Admission: RE | Admit: 2024-01-03 | Discharge: 2024-01-03 | Disposition: A | Discharge status: Home / Self Care | Attending: General Surgery | Admitting: General Surgery

## 2024-01-03 ENCOUNTER — Other Ambulatory Visit: Payer: Self-pay

## 2024-01-03 ENCOUNTER — Ambulatory Visit (HOSPITAL_COMMUNITY)
Admission: RE | Admit: 2024-01-03 | Discharge: 2024-01-03 | Disposition: A | Discharge status: Home / Self Care | Source: Ambulatory Visit | Attending: General Surgery | Admitting: General Surgery

## 2024-01-03 ENCOUNTER — Encounter (HOSPITAL_BASED_OUTPATIENT_CLINIC_OR_DEPARTMENT_OTHER)
Admission: RE | Disposition: A | Discharge status: Home / Self Care | Payer: Self-pay | Source: Home / Self Care | Attending: General Surgery

## 2024-01-03 ENCOUNTER — Encounter (HOSPITAL_BASED_OUTPATIENT_CLINIC_OR_DEPARTMENT_OTHER): Payer: Self-pay | Admitting: General Surgery

## 2024-01-03 DIAGNOSIS — F32A Depression, unspecified: Secondary | ICD-10-CM | POA: Insufficient documentation

## 2024-01-03 DIAGNOSIS — Z170 Estrogen receptor positive status [ER+]: Secondary | ICD-10-CM

## 2024-01-03 DIAGNOSIS — C50411 Malignant neoplasm of upper-outer quadrant of right female breast: Secondary | ICD-10-CM

## 2024-01-03 DIAGNOSIS — E119 Type 2 diabetes mellitus without complications: Secondary | ICD-10-CM | POA: Insufficient documentation

## 2024-01-03 DIAGNOSIS — E785 Hyperlipidemia, unspecified: Secondary | ICD-10-CM | POA: Diagnosis not present

## 2024-01-03 DIAGNOSIS — Z803 Family history of malignant neoplasm of breast: Secondary | ICD-10-CM | POA: Insufficient documentation

## 2024-01-03 DIAGNOSIS — Z1722 Progesterone receptor negative status: Secondary | ICD-10-CM | POA: Diagnosis not present

## 2024-01-03 DIAGNOSIS — Z7984 Long term (current) use of oral hypoglycemic drugs: Secondary | ICD-10-CM | POA: Insufficient documentation

## 2024-01-03 DIAGNOSIS — I10 Essential (primary) hypertension: Secondary | ICD-10-CM | POA: Insufficient documentation

## 2024-01-03 DIAGNOSIS — F419 Anxiety disorder, unspecified: Secondary | ICD-10-CM | POA: Diagnosis not present

## 2024-01-03 DIAGNOSIS — Z1731 Human epidermal growth factor receptor 2 positive status: Secondary | ICD-10-CM | POA: Diagnosis not present

## 2024-01-03 HISTORY — DX: Anxiety disorder, unspecified: F41.9

## 2024-01-03 HISTORY — PX: BREAST LUMPECTOMY WITH RADIOACTIVE SEED AND SENTINEL LYMPH NODE BIOPSY: SHX6550

## 2024-01-03 SURGERY — BREAST LUMPECTOMY WITH RADIOACTIVE SEED AND SENTINEL LYMPH NODE BIOPSY
Anesthesia: General | Site: Breast | Laterality: Right

## 2024-01-03 MED ORDER — HYDROMORPHONE HCL 1 MG/ML IJ SOLN
INTRAMUSCULAR | Status: AC
  Filled 2024-01-03: qty 0.5

## 2024-01-03 MED ORDER — LIDOCAINE 2% (20 MG/ML) 5 ML SYRINGE
INTRAMUSCULAR | Status: AC
  Filled 2024-01-03: qty 5

## 2024-01-03 MED ORDER — BUPIVACAINE-EPINEPHRINE 0.25% -1:200000 IJ SOLN
INTRAMUSCULAR | Status: DC | PRN
  Administered 2024-01-03: 20 mL

## 2024-01-03 MED ORDER — PHENYLEPHRINE 80 MCG/ML (10ML) SYRINGE FOR IV PUSH (FOR BLOOD PRESSURE SUPPORT)
PREFILLED_SYRINGE | INTRAVENOUS | Status: AC
  Filled 2024-01-03: qty 10

## 2024-01-03 MED ORDER — DEXAMETHASONE SODIUM PHOSPHATE 10 MG/ML IJ SOLN
INTRAMUSCULAR | Status: AC
  Filled 2024-01-03: qty 1

## 2024-01-03 MED ORDER — ACETAMINOPHEN 500 MG PO TABS
ORAL_TABLET | ORAL | Status: AC
  Filled 2024-01-03: qty 2

## 2024-01-03 MED ORDER — LIDOCAINE 2% (20 MG/ML) 5 ML SYRINGE
INTRAMUSCULAR | Status: DC | PRN
  Administered 2024-01-03: 50 mg via INTRAVENOUS

## 2024-01-03 MED ORDER — FENTANYL CITRATE (PF) 100 MCG/2ML IJ SOLN
100.0000 ug | Freq: Once | INTRAMUSCULAR | Status: AC
  Administered 2024-01-03: 50 ug via INTRAVENOUS

## 2024-01-03 MED ORDER — DEXMEDETOMIDINE HCL IN NACL 80 MCG/20ML IV SOLN
INTRAVENOUS | Status: DC | PRN
  Administered 2024-01-03 (×2): 4 ug via INTRAVENOUS

## 2024-01-03 MED ORDER — HEPARIN SOD (PORK) LOCK FLUSH 100 UNIT/ML IV SOLN
INTRAVENOUS | Status: AC
  Filled 2024-01-03: qty 5

## 2024-01-03 MED ORDER — PROPOFOL 500 MG/50ML IV EMUL
INTRAVENOUS | Status: DC | PRN
  Administered 2024-01-03: 100 ug/kg/min via INTRAVENOUS

## 2024-01-03 MED ORDER — LACTATED RINGERS IV SOLN: INTRAVENOUS | Status: DC

## 2024-01-03 MED ORDER — HYDROCODONE-ACETAMINOPHEN 5-325 MG PO TABS: 1.0000 | ORAL_TABLET | Freq: Four times a day (QID) | ORAL | 0 refills | Status: DC | PRN

## 2024-01-03 MED ORDER — FENTANYL CITRATE (PF) 100 MCG/2ML IJ SOLN
INTRAMUSCULAR | Status: DC | PRN
  Administered 2024-01-03: 50 ug via INTRAVENOUS
  Administered 2024-01-03 (×2): 25 ug via INTRAVENOUS

## 2024-01-03 MED ORDER — HYDROMORPHONE HCL 1 MG/ML IJ SOLN
0.2500 mg | INTRAMUSCULAR | Status: DC | PRN
  Administered 2024-01-03: 0.25 mg via INTRAVENOUS
  Administered 2024-01-03: 0.5 mg via INTRAVENOUS
  Administered 2024-01-03 (×3): 0.25 mg via INTRAVENOUS

## 2024-01-03 MED ORDER — BUPIVACAINE LIPOSOME 1.3 % IJ SUSP: INTRAMUSCULAR | Status: DC | PRN

## 2024-01-03 MED ORDER — CEFAZOLIN SODIUM-DEXTROSE 2-4 GM/100ML-% IV SOLN
2.0000 g | INTRAVENOUS | Status: AC
  Administered 2024-01-03: 2 g via INTRAVENOUS

## 2024-01-03 MED ORDER — BUPIVACAINE LIPOSOME 1.3 % IJ SUSP
INTRAMUSCULAR | Status: DC | PRN
  Administered 2024-01-03: 10 mL via PERINEURAL

## 2024-01-03 MED ORDER — OXYCODONE HCL 5 MG PO TABS
ORAL_TABLET | ORAL | Status: AC
  Filled 2024-01-03: qty 1

## 2024-01-03 MED ORDER — ONDANSETRON HCL 4 MG/2ML IJ SOLN
INTRAMUSCULAR | Status: AC
  Filled 2024-01-03: qty 2

## 2024-01-03 MED ORDER — FENTANYL CITRATE (PF) 100 MCG/2ML IJ SOLN
INTRAMUSCULAR | Status: AC
Start: 2024-01-03 — End: 2024-01-03
  Filled 2024-01-03: qty 2

## 2024-01-03 MED ORDER — DEXAMETHASONE SODIUM PHOSPHATE 10 MG/ML IJ SOLN
INTRAMUSCULAR | Status: DC | PRN
  Administered 2024-01-03: 6 mg via INTRAVENOUS

## 2024-01-03 MED ORDER — OXYCODONE HCL 5 MG PO TABS
5.0000 mg | ORAL_TABLET | Freq: Once | ORAL | Status: AC | PRN
  Administered 2024-01-03: 5 mg via ORAL

## 2024-01-03 MED ORDER — ACETAMINOPHEN 500 MG PO TABS
1000.0000 mg | ORAL_TABLET | ORAL | Status: AC
  Administered 2024-01-03: 1000 mg via ORAL

## 2024-01-03 MED ORDER — PROPOFOL 10 MG/ML IV BOLUS
INTRAVENOUS | Status: DC | PRN
  Administered 2024-01-03: 100 mg via INTRAVENOUS

## 2024-01-03 MED ORDER — OXYCODONE HCL 5 MG/5ML PO SOLN: 5.0000 mg | Freq: Once | ORAL | Status: AC | PRN

## 2024-01-03 MED ORDER — BUPIVACAINE HCL (PF) 0.5 % IJ SOLN
INTRAMUSCULAR | Status: DC | PRN
  Administered 2024-01-03: 20 mL via PERINEURAL

## 2024-01-03 MED ORDER — HEPARIN (PORCINE) IN NACL 1000-0.9 UT/500ML-% IV SOLN
INTRAVENOUS | Status: AC
  Filled 2024-01-03: qty 500

## 2024-01-03 MED ORDER — PHENYLEPHRINE 80 MCG/ML (10ML) SYRINGE FOR IV PUSH (FOR BLOOD PRESSURE SUPPORT)
PREFILLED_SYRINGE | INTRAVENOUS | Status: DC | PRN
  Administered 2024-01-03 (×3): 160 ug via INTRAVENOUS

## 2024-01-03 MED ORDER — MIDAZOLAM HCL 2 MG/2ML IJ SOLN
INTRAMUSCULAR | Status: AC
  Filled 2024-01-03: qty 2

## 2024-01-03 MED ORDER — GABAPENTIN 100 MG PO CAPS: 100.0000 mg | ORAL_CAPSULE | ORAL | Status: DC

## 2024-01-03 MED ORDER — CEFAZOLIN SODIUM-DEXTROSE 2-4 GM/100ML-% IV SOLN
INTRAVENOUS | Status: AC
  Filled 2024-01-03: qty 100

## 2024-01-03 MED ORDER — TECHNETIUM TC 99M TILMANOCEPT KIT
1.0000 | PACK | Freq: Once | INTRAVENOUS | Status: AC | PRN
  Administered 2024-01-03: 1 via INTRADERMAL

## 2024-01-03 MED ORDER — ONDANSETRON HCL 4 MG/2ML IJ SOLN: 4.0000 mg | Freq: Once | INTRAMUSCULAR | Status: DC | PRN

## 2024-01-03 MED ORDER — ONDANSETRON HCL 4 MG/2ML IJ SOLN
INTRAMUSCULAR | Status: DC | PRN
Start: 2024-01-03 — End: 2024-01-03
  Administered 2024-01-03: 4 mg via INTRAVENOUS

## 2024-01-03 SURGICAL SUPPLY — 46 items
BAG DECANTER FOR FLEXI CONT (MISCELLANEOUS) ×3 IMPLANT
BLADE SURG 15 STRL LF DISP TIS (BLADE) ×3 IMPLANT
CANISTER SUC SOCK COL 7IN (MISCELLANEOUS) IMPLANT
CANISTER SUCT 1200ML W/VALVE (MISCELLANEOUS) IMPLANT
CHLORAPREP W/TINT 26 (MISCELLANEOUS) ×3 IMPLANT
CLEANER CAUTERY TIP PAD (MISCELLANEOUS) ×2 IMPLANT
CLIP APPLIE 9.375 MED OPEN (MISCELLANEOUS) ×3 IMPLANT
COVER BACK TABLE 60X90IN (DRAPES) ×3 IMPLANT
COVER MAYO STAND STRL (DRAPES) ×3 IMPLANT
COVER PROBE CYLINDRICAL 5X96 (MISCELLANEOUS) ×2 IMPLANT
DERMABOND ADVANCED .7 DNX12 (GAUZE/BANDAGES/DRESSINGS) ×3 IMPLANT
DRAPE C-ARM 42X72 X-RAY (DRAPES) ×3 IMPLANT
DRAPE LAPAROSCOPIC ABDOMINAL (DRAPES) ×3 IMPLANT
DRAPE UTILITY XL STRL (DRAPES) ×3 IMPLANT
ELECT COATED BLADE 2.86 ST (ELECTRODE) ×3 IMPLANT
ELECTRODE REM PT RTRN 9FT ADLT (ELECTROSURGICAL) ×2 IMPLANT
GAUZE 4X4 16PLY ~~LOC~~+RFID DBL (SPONGE) ×2 IMPLANT
GLOVE BIO SURGEON STRL SZ7.5 (GLOVE) ×3 IMPLANT
GOWN STRL REUS W/ TWL LRG LVL3 (GOWN DISPOSABLE) ×6 IMPLANT
KIT MARKER MARGIN INK (KITS) ×3 IMPLANT
NDL HYPO 22X1.5 SAFETY MO (MISCELLANEOUS) IMPLANT
NDL HYPO 25X1 1.5 SAFETY (NEEDLE) ×2 IMPLANT
NDL SAFETY ECLIPSE 18X1.5 (NEEDLE) IMPLANT
NDL SPNL 22GX3.5 QUINCKE BK (NEEDLE) IMPLANT
NEEDLE HYPO 22X1.5 SAFETY MO (MISCELLANEOUS) IMPLANT
NEEDLE HYPO 25X1 1.5 SAFETY (NEEDLE) ×2 IMPLANT
NEEDLE SPNL 22GX3.5 QUINCKE BK (NEEDLE) IMPLANT
NS IRRIG 1000ML POUR BTL (IV SOLUTION) IMPLANT
PACK BASIN DAY SURGERY FS (CUSTOM PROCEDURE TRAY) ×2 IMPLANT
PENCIL SMOKE EVACUATOR (MISCELLANEOUS) ×2 IMPLANT
SLEEVE SCD COMPRESS KNEE MED (STOCKING) ×3 IMPLANT
SPIKE FLUID TRANSFER (MISCELLANEOUS) IMPLANT
SPONGE T-LAP 18X18 ~~LOC~~+RFID (SPONGE) ×3 IMPLANT
SUT MON AB 4-0 PC3 18 (SUTURE) ×6 IMPLANT
SUT PROLENE 2 0 SH DA (SUTURE) ×2 IMPLANT
SUT SILK 2 0 SH (SUTURE) IMPLANT
SUT SILK 2 0 TIES 17X18 (SUTURE) IMPLANT
SUT VIC AB 3-0 SH 27X BRD (SUTURE) ×2 IMPLANT
SUT VICRYL 3-0 CR8 SH (SUTURE) ×3 IMPLANT
SYR 10ML LL (SYRINGE) IMPLANT
SYR 5ML LL (SYRINGE) ×3 IMPLANT
SYR CONTROL 10ML LL (SYRINGE) ×6 IMPLANT
TOWEL GREEN STERILE FF (TOWEL DISPOSABLE) ×6 IMPLANT
TRAY FAXITRON CT DISP (TRAY / TRAY PROCEDURE) ×3 IMPLANT
TUBE CONNECTING 20X1/4 (TUBING) IMPLANT
YANKAUER SUCT BULB TIP NO VENT (SUCTIONS) IMPLANT

## 2024-01-03 NOTE — Anesthesia Procedure Notes (Signed)
 Procedure Name: LMA Insertion Date/Time: 01/03/2024 9:53 AM  Performed by: Raymona Caldwell, CRNAPre-anesthesia Checklist: Patient identified, Emergency Drugs available, Suction available and Patient being monitored Patient Re-evaluated:Patient Re-evaluated prior to induction Oxygen Delivery Method: Circle system utilized Preoxygenation: Pre-oxygenation with 100% oxygen Induction Type: IV induction Ventilation: Mask ventilation without difficulty LMA: LMA inserted LMA Size: 4.0 Number of attempts: 1 Airway Equipment and Method: Bite block Placement Confirmation: positive ETCO2 Tube secured with: Tape Dental Injury: Teeth and Oropharynx as per pre-operative assessment

## 2024-01-03 NOTE — Discharge Instructions (Addendum)
  Post Anesthesia Home Care Instructions  Activity: Get plenty of rest for the remainder of the day. A responsible individual must stay with you for 24 hours following the procedure.  For the next 24 hours, DO NOT: -Drive a car -Advertising copywriter -Drink alcoholic beverages -Take any medication unless instructed by your physician -Make any legal decisions or sign important papers.  Meals: Start with liquid foods such as gelatin or soup. Progress to regular foods as tolerated. Avoid greasy, spicy, heavy foods. If nausea and/or vomiting occur, drink only clear liquids until the nausea and/or vomiting subsides. Call your physician if vomiting continues.  Special Instructions/Symptoms: Your throat may feel dry or sore from the anesthesia or the breathing tube placed in your throat during surgery. If this causes discomfort, gargle with warm salt water. The discomfort should disappear within 24 hours.  If you had a scopolamine patch placed behind your ear for the management of post- operative nausea and/or vomiting:  1. The medication in the patch is effective for 72 hours, after which it should be removed.  Wrap patch in a tissue and discard in the trash. Wash hands thoroughly with soap and water. 2. You may remove the patch earlier than 72 hours if you experience unpleasant side effects which may include dry mouth, dizziness or visual disturbances. 3. Avoid touching the patch. Wash your hands with soap and water after contact with the patch.    Last Tylenol given at 7:49am today

## 2024-01-03 NOTE — Anesthesia Procedure Notes (Signed)
 Anesthesia Regional Block: Pectoralis block   Pre-Anesthetic Checklist: , timeout performed,  Correct Patient, Correct Site, Correct Laterality,  Correct Procedure, Correct Position, site marked,  Risks and benefits discussed,  Surgical consent,  Pre-op evaluation,  At surgeon's request and post-op pain management  Laterality: Right  Prep: chloraprep       Needles:  Injection technique: Single-shot  Needle Type: Echogenic Stimulator Needle     Needle Length: 10cm  Needle Gauge: 21   Needle insertion depth: 6 cm   Additional Needles:   Procedures:,,,, ultrasound used (permanent image in chart),,    Narrative:  Start time: 01/03/2024 9:17 AM End time: 01/03/2024 9:22 AM Injection made incrementally with aspirations every 5 mL.  Performed by: Personally  Anesthesiologist: Tura Gaines, MD  Additional Notes: Timeout performed. Patient sedated. Relevant anatomy ID'd using US . Incremental 2-5ml injection of LA with frequent aspiration. Patient tolerated procedure well.

## 2024-01-03 NOTE — Progress Notes (Signed)
 Assisted Dr. Malen Gauze with right, pectoralis, ultrasound guided block. Side rails up, monitors on throughout procedure. See vital signs in flow sheet. Tolerated Procedure well.

## 2024-01-03 NOTE — Op Note (Signed)
 01/03/2024  11:02 AM  PATIENT:  [name]  83 y.o. female  PRE-OPERATIVE DIAGNOSIS:  RIGHT BREAST CANCER  POST-OPERATIVE DIAGNOSIS:  right breast cancer  PROCEDURE:  Procedure(s) with comments: RIGHT BREAST BRACKETED SEED LOCALIZED LUMPECTOMY AND DEEP RIGHT AXILLARY SENTINEL NODE BIOPSY GEN w/ PEC BLOCK  SURGEON:  Surgeons and Role:    * Caralyn Chandler, MD - Primary  PHYSICIAN ASSISTANT:   ASSISTANTS: none   ANESTHESIA:   local and general  EBL:  10 mL   BLOOD ADMINISTERED:none  DRAINS: none   LOCAL MEDICATIONS USED:  MARCAINE     SPECIMEN:  Source of Specimen:  right breast tissue and sentinel nodes x 2  DISPOSITION OF SPECIMEN:  PATHOLOGY  COUNTS:  YES  TOURNIQUET:  * No tourniquets in log *  DICTATION: .Dragon Dictation  After informed consent was obtained the patient was brought to the operating room and placed in the supine position on the operating table.  After adequate induction of general anesthesia the patient's right chest, breast, and axillary area were prepped with ChloraPrep, allowed to dry, and draped in usual sterile manner.  An appropriate timeout was performed.  Previously 2 mag seeds were placed in the outer aspect of the right breast to bracket an area of invasive breast cancer.  Also earlier in the day the patient underwent injection of 1 mCi of technetium sulfur colloid in the subareolar position on the right.  Attention was first turned to the right axilla.  The neoprobe was set to technetium and an area of radioactivity was readily identified.  The area overlying this was infiltrated with quarter percent Marcaine.  A small transversely oriented incision was made with a 15 blade knife overlying the area of radioactivity.  The incision was carried through the skin and subcutaneous tissue sharply with the electrocautery until the deep right axillary space was entered.  The neoprobe was used to direct blunt hemostat dissection.  I was able to identify  2 hot lymph nodes.  Each of these was excised sharply with the electrocautery and the surrounding small vessels and lymphatics were controlled with clips.  Ex vivo counts on these nodes ranged from 200-400.  No other hot or palpable nodes were identified in the right axilla.  Hemostasis was achieved using the Bovie electrocautery.  The deep part of the incision was then closed with interrupted 3-0 Vicryl stitches.  The skin was closed with a running 4-0 Monocryl subcuticular stitch.  Attention was then turned to the right breast.  The Sentimag was used to identify the Magseed signals in the outer right breast.  Because of the orientation of the cancer I elected to make an elliptical incision in the skin in a horizontal fashion overlying the cancer.  The incision was carried through the skin and subcutaneous tissue sharply with the electrocautery.  Dissection was then carried around the 2 mag seeds while checking the signals frequently.  This dissection was carried all the way to the muscle of the chest wall.  Once this dissection was complete then the specimen was removed from the patient.  It was oriented with the appropriate paint colors.  A specimen radiograph was obtained that showed the clip and 2 seeds to be near the center of the specimen.  The specimen was then sent to pathology for further evaluation.  Hemostasis was achieved using the Bovie electrocautery.  The wound was irrigated with saline and infiltrated with more quarter percent Marcaine.  The cavity was marked with  clips.  The deep layer of the incision was closed with interrupted 3-0 Vicryl stitches.  The skin was closed with a running 4-0 Monocryl subcuticular stitch.  Dermabond dressings were applied.  The patient tolerated the procedure well.  At the end of the case all needle sponge and instrument counts were correct.  The patient was then awakened and taken to recovery in stable condition.  PLAN OF CARE: Discharge to home after  PACU  PATIENT DISPOSITION:  PACU - hemodynamically stable.   Delay start of Pharmacological VTE agent (>24hrs) due to surgical blood loss or risk of bleeding: not applicable

## 2024-01-03 NOTE — H&P (Signed)
 REFERRING PHYSICIAN: Sonja [place], MD PROVIDER: Arlester Bence, MD MRN: [id number] DOB: 19-Jul-1941 Subjective    Chief Complaint: Breast Cancer  History of Present Illness: [name] is a 83 y.o. female who is seen today as an office consultation for evaluation of Breast Cancer  We are asked to see the patient in consultation by Dr. Maryalice Smaller to evaluate her for a new right breast cancer. The patient is a 83 year old white female who recently had her first mammogram in several years. She was found to have a mass in the upper outer quadrant of the right breast measuring 2.4 cm with some calcification off it anteriorly which made the total measurement 4 cm. The axilla looked normal. 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 She is otherwise in pretty good health and does not smoke. He does have a daughter who was diagnosed with breast cancer and treated a year ago  Review of Systems: A complete review of systems was obtained from the patient. I have reviewed this information and discussed as appropriate with the patient. See HPI as well for other ROS.  ROS   Medical History: Past Medical History:  Diagnosis Date  Anxiety  Arthritis  GERD (gastroesophageal reflux disease)  History of cancer  Hyperlipidemia  Thyroid  disease   Patient Active Problem List  Diagnosis  Malignant neoplasm of upper-outer quadrant of right breast in female, estrogen receptor positive (CMS/HHS-HCC)  Acquired hypothyroidism   Past Surgical History:  Procedure Laterality Date  APPENDECTOMY  CHOLECYSTECTOMY  HYSTERECTOMY    Allergies  Allergen Reactions  Codeine Dizziness, Nausea And Vomiting and Vomiting  Celecoxib Dizziness  Dizziness  Morphine Other (See Comments), Nausea and Nausea And Vomiting  Tramadol Other (See Comments) and Nausea And Vomiting   Current Outpatient Medications on File  Prior to Visit  Medication Sig Dispense Refill  metFORMIN (GLUCOPHAGE-XR) 500 MG XR tablet Take 500 mg by mouth once daily  aspirin 81 MG EC tablet Take 81 mg by mouth once daily  beta carotene 25000 UNIT capsule Take 25,000 Units by mouth every other day  diazePAM (VALIUM) 2 MG tablet Take 1 tablet by mouth 3 (three) times daily  EUA molnupiravir (LAGEVRIO, EUA,) 200 mg capsule Take 4 capsules by mouth every 12 (twelve) hours  levothyroxine (SYNTHROID) 50 MCG tablet Take 1 tablet by mouth every morning before breakfast (0630)  lovastatin (MEVACOR) 20 MG tablet Take 20 mg by mouth daily with dinner  nebivoloL (BYSTOLIC) 5 MG tablet Take 1 tablet by mouth once daily  omeprazole (PRILOSEC) 40 MG DR capsule Take 40 mg by mouth once daily Before breakfast   No current facility-administered medications on file prior to visit.   Family History  Problem Relation Age of Onset  Obesity Sister  High blood pressure (Hypertension) Sister  Coronary Artery Disease (Blocked arteries around heart) Sister  Coronary Artery Disease (Blocked arteries around heart) Brother    Social History   Tobacco Use  Smoking Status Never  Smokeless Tobacco Never    Social History   Socioeconomic History  Marital status: Married  Tobacco Use  Smoking status: Never  Smokeless tobacco: Never   Objective:  There were no vitals filed for this visit.  There is no height or weight on file to calculate BMI.  Physical Exam Vitals reviewed.  Constitutional:  General: She is not in acute distress. Appearance: Normal appearance.  HENT:  Head: Normocephalic and atraumatic.  Right Ear: External ear normal.  Left Ear: External ear normal.  Nose: Nose normal.  Mouth/Throat:  Mouth: Mucous membranes are moist.  Pharynx: Oropharynx is clear.  Eyes:  General: No scleral icterus. Extraocular Movements: Extraocular movements intact.  Conjunctiva/sclera: Conjunctivae normal.  Pupils: Pupils are equal, round, and  reactive to light.  Cardiovascular:  Rate and Rhythm: Normal rate and regular rhythm.  Pulses: Normal pulses.  Heart sounds: Normal heart sounds.  Pulmonary:  Effort: Pulmonary effort is normal. No respiratory distress.  Breath sounds: Normal breath sounds.  Abdominal:  General: Bowel sounds are normal.  Palpations: Abdomen is soft.  Tenderness: There is no abdominal tenderness.  Musculoskeletal:  General: No swelling, tenderness or deformity. Normal range of motion.  Cervical back: Normal range of motion and neck supple.  Skin: General: Skin is warm and dry.  Coloration: Skin is not jaundiced.  Neurological:  General: No focal deficit present.  Mental Status: She is alert and oriented to person, place, and time.  Psychiatric:  Mood and Affect: Mood normal.  Behavior: Behavior normal.     Breast: There is a 2-1/2 cm palpable mass in the upper outer quadrant of the right breast. There is no palpable mass in the left breast. There is no palpable axillary, supraclavicular, or cervical lymphadenopathy.  Labs, Imaging and Diagnostic Testing:  Assessment and Plan:   Diagnoses and all orders for this visit:  Malignant neoplasm of upper-outer quadrant of right breast in female, estrogen receptor positive (CMS/HHS-HCC) - [facility] Case Posting Request; Future   The patient appears to have a 4 cm area of cancer in the upper outer quadrant of the right breast that was invasive lobular cancer. At this point we would like to evaluate her further with an MRI given the uncertain nature of lobular cancers. I have discussed with her in detail the different options for treatment and at this point she favors breast conservation. If the MRI looks similar to what we know then she would be a candidate for breast conservation. She would also be a good candidate for sentinel node biopsy. I have discussed with her in detail the risks and benefits of the operation as well as some of the technical aspects  including use of a radioactive seed to bracket the area and she understands and wishes to proceed. We will call her with the MRI and then move forward with surgical scheduling. She will meet with medical and radiation oncology to discuss adjuvant therapy as well

## 2024-01-03 NOTE — Anesthesia Postprocedure Evaluation (Signed)
 Anesthesia Post Note  Patient: [name]  Procedure(s) Performed: BREAST LUMPECTOMY WITH RADIOACTIVE SEED AND SENTINEL LYMPH NODE BIOPSY (Right: Breast)     Patient location during evaluation: PACU Anesthesia Type: General Level of consciousness: awake and alert and oriented Pain management: pain level controlled Vital Signs Assessment: post-procedure vital signs reviewed and stable Respiratory status: spontaneous breathing, nonlabored ventilation and respiratory function stable Cardiovascular status: blood pressure returned to baseline and stable Postop Assessment: no apparent nausea or vomiting Anesthetic complications: no   No notable events documented.  Last Vitals:  Vitals:   01/03/24 1215 01/03/24 1230  BP: 138/83   Pulse: 74 72  Resp: (!) 9 (!) 8  Temp:    SpO2: 97% 94%    Last Pain:  Vitals:   01/03/24 1230  TempSrc:   PainSc: 1                  Diona Peregoy A.

## 2024-01-03 NOTE — Interval H&P Note (Addendum)
 History and Physical Interval Note:  01/03/2024 8:59 AM  [name]  has presented today for surgery, with the diagnosis of RIGHT BREAST CANCER.  The various methods of treatment have been discussed with the patient and family. After consideration of risks, benefits and other options for treatment, the patient has consented to  Procedure(s) with comments: BREAST LUMPECTOMY WITH RADIOACTIVE SEED AND SENTINEL LYMPH NODE BIOPSY (Right) - RIGHT BREAST BRACKETED SEED LOCALIZED LUMPECTOMY AND SENTINEL NODE BIOPSY GEN w/ PEC BLOCK INSERTION, TUNNELED CENTRAL VENOUS DEVICE, WITH PORT (Right) as a surgical intervention.  The patient's history has been reviewed, patient examined, no change in status, stable for surgery.  I have reviewed the patient's chart and labs.  Questions were answered to the patient's satisfaction.   After speaking to pt today she is doubtful she can tolerate chemo and has decided not to have port placed.  Lillette Reid III

## 2024-01-03 NOTE — Transfer of Care (Signed)
 Immediate Anesthesia Transfer of Care Note  Patient: [name]  Procedure(s) Performed: BREAST LUMPECTOMY WITH RADIOACTIVE SEED AND SENTINEL LYMPH NODE BIOPSY (Right: Breast)  Patient Location: PACU  Anesthesia Type:GA combined with regional for post-op pain  Level of Consciousness: awake and patient cooperative  Airway & Oxygen Therapy: Patient Spontanous Breathing and Patient connected to face mask oxygen  Post-op Assessment: Report given to RN and Post -op Vital signs reviewed and stable  Post vital signs: Reviewed and stable  Last Vitals:  Vitals Value Taken Time  BP 131/71 01/03/24 11:15  Temp    Pulse 75 01/03/24 11:19  Resp 14 01/03/24 11:19  SpO2 96 % 01/03/24 11:19  Vitals shown include unfiled device data.  Last Pain:  Vitals:   01/03/24 0746  TempSrc: Temporal  PainSc: 0-No pain      Patients Stated Pain Goal: 3 (01/03/24 0746)  Complications: No notable events documented.

## 2024-01-04 ENCOUNTER — Encounter (HOSPITAL_BASED_OUTPATIENT_CLINIC_OR_DEPARTMENT_OTHER): Payer: Self-pay | Admitting: General Surgery

## 2024-01-04 ENCOUNTER — Encounter: Payer: Self-pay | Admitting: *Deleted

## 2024-01-07 ENCOUNTER — Ambulatory Visit: Payer: Self-pay | Admitting: General Surgery

## 2024-01-18 ENCOUNTER — Encounter: Payer: Self-pay | Admitting: Hematology

## 2024-01-18 ENCOUNTER — Inpatient Hospital Stay: Attending: Hematology | Admitting: Hematology

## 2024-01-18 VITALS — BP 138/78 | HR 89 | Temp 97.3°F | Resp 16 | Ht 64.0 in | Wt 141.8 lb

## 2024-01-18 DIAGNOSIS — M069 Rheumatoid arthritis, unspecified: Secondary | ICD-10-CM | POA: Diagnosis not present

## 2024-01-18 DIAGNOSIS — G8918 Other acute postprocedural pain: Secondary | ICD-10-CM | POA: Insufficient documentation

## 2024-01-18 DIAGNOSIS — Z1722 Progesterone receptor negative status: Secondary | ICD-10-CM | POA: Insufficient documentation

## 2024-01-18 DIAGNOSIS — I10 Essential (primary) hypertension: Secondary | ICD-10-CM | POA: Diagnosis not present

## 2024-01-18 DIAGNOSIS — C50411 Malignant neoplasm of upper-outer quadrant of right female breast: Secondary | ICD-10-CM | POA: Insufficient documentation

## 2024-01-18 DIAGNOSIS — Z170 Estrogen receptor positive status [ER+]: Secondary | ICD-10-CM | POA: Diagnosis not present

## 2024-01-18 DIAGNOSIS — E119 Type 2 diabetes mellitus without complications: Secondary | ICD-10-CM | POA: Diagnosis not present

## 2024-01-18 DIAGNOSIS — Z79899 Other long term (current) drug therapy: Secondary | ICD-10-CM | POA: Diagnosis not present

## 2024-01-18 DIAGNOSIS — Z1731 Human epidermal growth factor receptor 2 positive status: Secondary | ICD-10-CM | POA: Insufficient documentation

## 2024-01-18 NOTE — Progress Notes (Signed)
 [facility]   [phone] [phone]   Clinic Follow up Note   Patient Care Team: Pia Kerney SQUIBB, MD as PCP - General (Internal Medicine) Glean Stephane BROCKS, RN (Inactive) as Oncology Nurse Navigator Tyree Nanetta SAILOR, RN as Oncology Nurse Navigator Lanny Callander, MD as Consulting Physician (Hematology) Curvin Deward MOULD, MD as Consulting Physician (General Surgery) Shannon Agent, MD as Consulting Physician (Radiation Oncology)  Date of Service:  01/18/2024  CHIEF COMPLAINT: f/u of right breast cancer  CURRENT THERAPY:  Pending adjuvant therapy  Oncology History   Malignant neoplasm of upper-outer quadrant of right breast in female, estrogen receptor positive (HCC) -pT2N0M0, stage IIA, ER 15% weak +, PR-, HER2+, G3 - Patient presented with breast pain and a palpable right breast lump.  Mammogram showed 2.3 cm mass in the upper outer quadrant of right breast, biopsy showed invasive lobular carcinoma, ER weakly positive, PR negative, and HER2 positive. 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 -I recommend adjuvant chemotherapy with Kadcyla for 3 to 4 months, then changed to trastuzumab to complete 1 year therapy  Assessment & Plan Breast cancer Breast cancer with ER weakly positive, HER2 positive status. Post-surgical follow-up shows healing incisions without infection. Chemotherapy options discussed considering age and comorbidities. Paclitaxel weekly for 12 weeks was considered but not preferred due to potential side effects and treatment burden. Kadcyla (trastuzumab emtansine) every three weeks was preferred due to less frequent dosing and reduced side effects, including less hair loss. Potential recurrence risk (30-35% without treatment) discussed. - Refer to Dr. Ezzard for continuation of care and chemotherapy administration - Administer Kadcyla  (trastuzumab emtansine) every three weeks for 3 to 4 months per her tolerance, then changed to trastuzumab every 3 weeks to completed 1 year therapy - Coordinate care transfer to [place] for convenience - Discuss radiation therapy with radiation oncologist  Postoperative pain Persistent postoperative pain at the surgical site, particularly in the axilla, with soreness and scar tissue formation. Pain is exacerbated by movement and pressure. Tylenol  provides limited relief. Allergic to codeine and tramadol. Discussed potential use of ibuprofen for better pain management, considering her concerns about NSAID use due to other health conditions. - Prescribe ibuprofen 600 mg for pain management - Continue Tylenol  as needed for pain  Diabetes mellitus Diabetes mellitus mentioned as a comorbidity. No specific discussion on management or changes in treatment during this visit.  Rheumatoid arthritis Rheumatoid arthritis mentioned as a comorbidity. No specific discussion on management or changes in treatment during this visit.   Plan - I reviewed her surgical pathology findings - I recommend adjuvant chemotherapy with Kadcyla for 3 to 4 months, then changed to trastuzumab to complete 1 year therapy - Patient does not want a port - Will refer her to Dr. Ezzard in [place], to get treatment closer to home  SUMMARY OF ONCOLOGIC HISTORY: Oncology History  Malignant neoplasm of upper-outer quadrant of right breast in female, estrogen receptor positive (HCC)  11/26/2023 Cancer Staging   Staging form: Breast, AJCC 8th Edition - Clinical stage from 11/26/2023: Stage IIA (cT2, cN0, cM0, G2, ER+, PR-, HER2+) - Signed by Lanny Callander, MD on 12/05/2023 Nuclear grade: G2 Histologic grading system: 3 grade system   12/03/2023 Initial Diagnosis   Malignant neoplasm of upper-outer quadrant of right breast in female, estrogen receptor positive (HCC)   01/03/2024 Cancer Staging   Staging form: Breast, AJCC 8th  Edition -  Pathologic stage from 01/03/2024: Stage IIA (pT2, pN0, cM0, G3, ER+, PR-, HER2+) - Signed by Lanny Callander, MD on 01/18/2024 Stage prefix: Initial diagnosis Histologic grading system: 3 grade system Residual tumor (R): R0      Discussed the use of AI scribe software for clinical note transcription with the patient, who gave verbal consent to proceed.  History of Present Illness [name] is an 83 year old female with breast cancer who presents for follow-up after surgery.  She experiences ongoing pain and discomfort following her breast cancer surgery, particularly severe when wearing a bra or when her arm moves and rubs against the incision site. The incision is indented and very sore, especially on the top part. Pain management has primarily been with Tylenol , which provides some relief but is not fully effective. Hydrocodone  was effective but caused nausea, managed by taking it with milk. She is allergic to codeine and tramadol. She has not been taking ibuprofen due to concerns about her other medical conditions.  She is concerned about the potential side effects of chemotherapy and its impact on her quality of life given her age and existing health conditions.  No fever, redness, or swelling around the incision site. Soreness and pain at the incision site, particularly when pressure is applied.     All other systems were reviewed with the patient and are negative.  MEDICAL HISTORY:  Past Medical History:  Diagnosis Date   Anxiety    Cancer (HCC) 11/2023   right breast ILC   Chest discomfort    Depression    Diabetes mellitus without complication (HCC)    no meds at present   Diverticulosis    Dyspnea    with exertion   Fatigue    Gastritis    History of bronchitis    History of thrombocytopenia    related to medications   HLD (hyperlipidemia)    HTN (hypertension)    pt states BP is labile, no meds at present   IBS (irritable bowel syndrome)    Lichen  planus    mouth, feet, hands   Osteoarthritis    hands, back    SURGICAL HISTORY: Past Surgical History:  Procedure Laterality Date   ABDOMINAL HYSTERECTOMY     APPENDECTOMY     BREAST LUMPECTOMY WITH RADIOACTIVE SEED AND SENTINEL LYMPH NODE BIOPSY Right 01/03/2024   Procedure: BREAST LUMPECTOMY WITH RADIOACTIVE SEED AND SENTINEL LYMPH NODE BIOPSY;  Surgeon: Curvin Deward MOULD, MD;  Location: [facility];  Service: General;  Laterality: Right;  RIGHT BREAST BRACKETED SEED LOCALIZED LUMPECTOMY AND SENTINEL NODE BIOPSY GEN w/ PEC BLOCK   CATARACT EXTRACTION, BILATERAL     CHOLECYSTECTOMY     CT Virtual Colonscopy   10/29/2014   ESOPHAGOGASTRODUODENOSCOPY  05/10/2011   Moderate gastritis. Otherwise normal EGD   hysterectomy - unknown type     knee surgery Bilateral    laporoscopic   MOHS SURGERY     Basal Cell Carcinoma- right cheek    I have reviewed the social history and family history with the patient and they are unchanged from previous note.  ALLERGIES:  is allergic to celebrex [celecoxib], codeine, morphine and codeine, and tramadol.  MEDICATIONS:  Current Outpatient Medications  Medication Sig Dispense Refill   Beta Carotene (VITAMIN A) 25000 UNIT capsule Take 25,000 Units by mouth every other day.     calcium carbonate 200 MG capsule Take 250 mg by mouth every other day.       Cholecalciferol (  VITAMIN D3) 3000 UNITS TABS Take 5,000 Units by mouth every other day.      cyanocobalamin  500 MCG tablet Take 5,000 mcg by mouth every other day.      HYDROcodone -acetaminophen  (NORCO/VICODIN) 5-325 MG tablet Take 1-2 tablets by mouth every 6 (six) hours as needed. 10 tablet 0   levothyroxine (SYNTHROID) 25 MCG tablet Take 25 mcg by mouth daily.     lovastatin (MEVACOR) 20 MG tablet Take 20 mg by mouth at bedtime.       vitamin C (ASCORBIC ACID) 250 MG tablet Take 250 mg by mouth every other day.     vitamin E 180 MG (400 UNITS) capsule Take 400 Units by mouth every  other day.     zinc gluconate 50 MG tablet Take 50 mg by mouth every other day.       No current facility-administered medications for this visit.    PHYSICAL EXAMINATION: ECOG PERFORMANCE STATUS: 1 - Symptomatic but completely ambulatory  Vitals:   01/18/24 1507  BP: 138/78  Pulse: 89  Resp: 16  Temp: (!) 97.3 F (36.3 C)  SpO2: 97%   Wt Readings from Last 3 Encounters:  01/18/24 141 lb 12.8 oz (64.3 kg)  01/03/24 140 lb 3.4 oz (63.6 kg)  12/05/23 143 lb 8 oz (65.1 kg)     GENERAL:alert, no distress and comfortable SKIN: skin color, texture, turgor are normal, no rashes or significant lesions EYES: normal, Conjunctiva are pink and non-injected, sclera clear NECK: supple, thyroid  normal size, non-tender, without nodularity LYMPH:  no palpable lymphadenopathy in the cervical, axillary  LUNGS: clear to auscultation and percussion with normal breathing effort HEART: regular rate & rhythm and no murmurs and no lower extremity edema ABDOMEN:abdomen soft, non-tender and normal bowel sounds Musculoskeletal:no cyanosis of digits and no clubbing  NEURO: alert & oriented x 3 with fluent speech, no focal motor/sensory deficits  Physical Exam BREAST: Breast incision lateral with scar tissue, healing well, no infection. Axillary incision healing well with scar tissue.  LABORATORY DATA:  I have reviewed the data as listed    Latest Ref Rng & Units 12/05/2023   12:12 PM 05/27/2021    3:36 PM 02/22/2018    2:18 PM  CBC  WBC 4.0 - 10.5 K/uL 7.4  8.5  6.5   Hemoglobin 12.0 - 15.0 g/dL 87.8  85.9  86.4   Hematocrit 36.0 - 46.0 % 35.6  41.5  40.2   Platelets 150 - 400 K/uL 178  223  179.0         Latest Ref Rng & Units 12/05/2023   12:12 PM 05/27/2021    3:36 PM 02/22/2018    2:18 PM  CMP  Glucose 70 - 99 mg/dL 858  884  880   BUN 8 - 23 mg/dL 9  13  7    Creatinine 0.44 - 1.00 mg/dL 9.39  9.27  9.30   Sodium 135 - 145 mmol/L 136  138  140   Potassium 3.5 - 5.1 mmol/L 3.8  4.4  3.9    Chloride 98 - 111 mmol/L 101  101  103   CO2 22 - 32 mmol/L 30  24  30    Calcium 8.9 - 10.3 mg/dL 9.1  9.4  9.2   Total Protein 6.5 - 8.1 g/dL 6.5  6.4  6.7   Total Bilirubin 0.0 - 1.2 mg/dL 0.4  0.4  0.6   Alkaline Phos 38 - 126 U/L 58   62   AST 15 -  41 U/L 14  16  13    ALT 0 - 44 U/L 10  11  9        RADIOGRAPHIC STUDIES: I have personally reviewed the radiological images as listed and agreed with the findings in the report. No results found.    No orders of the defined types were placed in this encounter.  All questions were answered. The patient knows to call the clinic with any problems, questions or concerns. No barriers to learning was detected. The total time spent in the appointment was 40 minutes, including review of chart and various tests results, discussions about plan of care and coordination of care plan     Onita Mattock, MD 01/18/2024

## 2024-01-18 NOTE — Assessment & Plan Note (Addendum)
-  pT2N0M0, stage IIA, ER 15% weak +, PR-, HER2+, G3 - Patient presented with breast pain and a palpable right breast lump.  Mammogram showed 2.3 cm mass in the upper outer quadrant of right breast, biopsy showed invasive lobular carcinoma, ER weakly positive, PR negative, and HER2 positive. 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 -I recommend adjuvant chemotherapy with Kadcyla for 3 to 4 months, then changed to trastuzumab to complete 1 year therapy

## 2024-01-21 ENCOUNTER — Other Ambulatory Visit: Payer: Self-pay | Admitting: *Deleted

## 2024-01-21 ENCOUNTER — Encounter: Payer: Self-pay | Admitting: *Deleted

## 2024-01-21 DIAGNOSIS — Z170 Estrogen receptor positive status [ER+]: Secondary | ICD-10-CM

## 2024-01-22 ENCOUNTER — Telehealth: Payer: Self-pay | Admitting: Oncology

## 2024-01-22 LAB — SURGICAL PATHOLOGY

## 2024-01-22 NOTE — Telephone Encounter (Signed)
 Contacted pt to schedule an appt. Unable to reach via phone, voicemail was left.

## 2024-01-22 NOTE — Progress Notes (Unsigned)
 [facility]  [address] [place],  [place]  [address] [phone]  Clinic Day:  01/22/2024  Referring physician: Lanny Callander, MD   HISTORY OF PRESENT ILLNESS:  [name] is a 83 y.o. female who I was asked to consult upon for newly diagnosed breast cancer.  Her history dates back to *** when ***.  A screening/diagnostic mammogram and ultrasound showed a ***.  A biopsy of this lesion was done, which came back positive for ***.  This led to the patient undergoing a lump/mastectomy, which revealed a ***.  Receptor testing showed her tumor to be estrogen receptor ***, progesterone receptor ***, and Her2/neu receptor ***.  *** sentinel lymph nodes were removed, which came back *** for nodal involvement.  The patient comes in today to go over her breast cancer surgical pathology and its implications.   Of note, her last mammogram was ***.  Her family history is pertinent for ***breast cancer.  There *** ovarian cancer PAST MEDICAL HISTORY:   Past Medical History:  Diagnosis Date   Anxiety    Cancer (HCC) 11/2023   right breast ILC   Chest discomfort    Depression    Diabetes mellitus without complication (HCC)    no meds at present   Diverticulosis    Dyspnea    with exertion   Fatigue    Gastritis    History of bronchitis    History of thrombocytopenia    related to medications   HLD (hyperlipidemia)    HTN (hypertension)    pt states BP is labile, no meds at present   IBS (irritable bowel syndrome)    Lichen planus    mouth, feet, hands   Osteoarthritis    hands, back    PAST SURGICAL HISTORY:   Past Surgical History:  Procedure Laterality Date   ABDOMINAL HYSTERECTOMY     APPENDECTOMY     BREAST LUMPECTOMY WITH RADIOACTIVE SEED AND SENTINEL LYMPH NODE BIOPSY Right 01/03/2024   Procedure: BREAST LUMPECTOMY WITH RADIOACTIVE SEED AND SENTINEL LYMPH NODE BIOPSY;  Surgeon: Curvin Deward MOULD, MD;  Location: [facility];   Service: General;  Laterality: Right;  RIGHT BREAST BRACKETED SEED LOCALIZED LUMPECTOMY AND SENTINEL NODE BIOPSY GEN w/ PEC BLOCK   CATARACT EXTRACTION, BILATERAL     CHOLECYSTECTOMY     CT Virtual Colonscopy   10/29/2014   ESOPHAGOGASTRODUODENOSCOPY  05/10/2011   Moderate gastritis. Otherwise normal EGD   hysterectomy - unknown type     knee surgery Bilateral    laporoscopic   MOHS SURGERY     Basal Cell Carcinoma- right cheek    CURRENT MEDICATIONS:   Current Outpatient Medications  Medication Sig Dispense Refill   Beta Carotene (VITAMIN A) 25000 UNIT capsule Take 25,000 Units by mouth every other day.     calcium carbonate 200 MG capsule Take 250 mg by mouth every other day.       Cholecalciferol (VITAMIN D3) 3000 UNITS TABS Take 5,000 Units by mouth every other day.      cyanocobalamin  500 MCG tablet Take 5,000 mcg by mouth every other day.      HYDROcodone -acetaminophen  (NORCO/VICODIN) 5-325 MG tablet Take 1-2 tablets by mouth every 6 (six) hours as needed. 10 tablet 0   levothyroxine (SYNTHROID) 25 MCG tablet Take 25 mcg by mouth daily.     lovastatin (MEVACOR) 20 MG tablet Take 20 mg by mouth at bedtime.       vitamin C (ASCORBIC  ACID) 250 MG tablet Take 250 mg by mouth every other day.     vitamin E 180 MG (400 UNITS) capsule Take 400 Units by mouth every other day.     zinc gluconate 50 MG tablet Take 50 mg by mouth every other day.       No current facility-administered medications for this visit.    ALLERGIES:   Allergies  Allergen Reactions   Celebrex [Celecoxib]     Dizziness    Codeine    Morphine And Codeine    Tramadol Nausea And Vomiting    FAMILY HISTORY:   Family History  Problem Relation Age of Onset   Other Mother        accident   Other Father    Heart disease Sister    Heart disease Brother    Coronary artery disease Other    Cancer Daughter 71       breast cancer    SOCIAL HISTORY:   reports that she has never smoked. She has never  used smokeless tobacco. She reports that she does not drink alcohol and does not use drugs.  REVIEW OF SYSTEMS:  Review of Systems - Oncology   PHYSICAL EXAM:  There were no vitals taken for this visit. Wt Readings from Last 3 Encounters:  01/18/24 141 lb 12.8 oz (64.3 kg)  01/03/24 140 lb 3.4 oz (63.6 kg)  12/05/23 143 lb 8 oz (65.1 kg)   There is no height or weight on file to calculate BMI. Performance status (ECOG): {CHL ONC [id number] Physical Exam  LABS:      Latest Ref Rng & Units 12/05/2023   12:12 PM 05/27/2021    3:36 PM 02/22/2018    2:18 PM  CBC  WBC 4.0 - 10.5 K/uL 7.4  8.5  6.5   Hemoglobin 12.0 - 15.0 g/dL 87.8  85.9  86.4   Hematocrit 36.0 - 46.0 % 35.6  41.5  40.2   Platelets 150 - 400 K/uL 178  223  179.0       Latest Ref Rng & Units 12/05/2023   12:12 PM 05/27/2021    3:36 PM 02/22/2018    2:18 PM  CMP  Glucose 70 - 99 mg/dL 858  884  880   BUN 8 - 23 mg/dL 9  13  7    Creatinine 0.44 - 1.00 mg/dL 9.39  9.27  9.30   Sodium 135 - 145 mmol/L 136  138  140   Potassium 3.5 - 5.1 mmol/L 3.8  4.4  3.9   Chloride 98 - 111 mmol/L 101  101  103   CO2 22 - 32 mmol/L 30  24  30    Calcium 8.9 - 10.3 mg/dL 9.1  9.4  9.2   Total Protein 6.5 - 8.1 g/dL 6.5  6.4  6.7   Total Bilirubin 0.0 - 1.2 mg/dL 0.4  0.4  0.6   Alkaline Phos 38 - 126 U/L 58   62   AST 15 - 41 U/L 14  16  13    ALT 0 - 44 U/L 10  11  9       No results found for: CEA1, CEA / No results found for: CEA1, CEA No results found for: PSA1 No results found for: CAN199 No results found for: CAN125  No results found for: TOTALPROTELP, ALBUMINELP, A1GS, A2GS, BETS, BETA2SER, GAMS, MSPIKE, SPEI No results found for: TIBC, FERRITIN, IRONPCTSAT No results found for: LDH  Review Flowsheet  No data to display           STUDIES:  Her breast cancer pathology revealed the following:  FINAL MICROSCOPIC DIAGNOSIS:  A. LYMPH NODE, RIGHT AXILLARY #1,  SENTINEL, EXCISION: - One lymph node negative for malignancy (0/1). - See cancer summary below.  B. LYMPH NODE, RIGHT AXILLARY #2, SENTINEL, EXCISION: - One lymph node negative for malignancy (0/1). - See cancer summary below. - A pancytokeratin stain was examined due areas of marked thermal artifact.  C. 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 - See cancer summary below. - Biopsy site with coil clip. - Unremarkable skin.  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 Histologic Grade (Nottingham Histologic Score)      Glandular (Acinar)/Tubular Differentiation: 3      Nuclear Pleomorphism: 3      Mitotic Rate: 3      Overall Grade: 3 Tumor Size: Greatest dimension of largest invasive focus: 35 mm Ductal Carcinoma In Situ (DCIS): Not identified Tumor Extent: Not applicable Lymphatic and/or Vascular Invasion: Not identified Treatment Effect in the Breast: No known presurgical therapy  MARGINS Margin Status for Invasive Carcinoma: All margins negative for invasive carcinoma      Distance from closest margin: 11 mm      Specify closest margin: Posterior  Margin Status for DCIS / PLCIS: All margins negative for DCIS / PLCIS      Distance from DCIS to closest margin: 2 mm      Specify closest margin: Superior  REGIONAL LYMPH NODES Regional Lymph Node Status: All regional lymph nodes negative for tumor      Total Number of Lymph Nodes Examined (sentinel and non-sentinel): 2       Number of Sentinel Nodes Examined: 2  DISTANT METASTASIS Distant Site(s) Involved, if applicable: Not applicable  PATHOLOGIC STAGE CLASSIFICATION (pTNM, AJCC 8th Edition): Modified  Classification: Not applicable pT Category: pT2      T Suffix: Not applicable pN Category: pN0      N Suffix: (sn) pM Category: Not applicable  ADDENDUM:  PROGNOSTIC INDICATOR RESULTS (REPEAT):   Immunohistochemical and morphometric analysis performed manually.   The tumor cells are POSITIVE for Her2 (3+).   Estrogen Receptor:       POSITIVE, 35%, MOERATE TO STRONG STAINING  Progesterone Receptor:   NEGATIVE (0)  Proliferation Marker Ki-67:   20%    ASSESSMENT & PLAN:   [name] is a 83 y.o. female who I was asked to consult upon for newly diagnosed stage *** breast cancer, status post a lump/mastectomy in ***.     The patient understands all the plans discussed today and is in agreement with them.  I do appreciate Lanny Callander, MD for his new consult. so   Deserie Dirks DELENA Kerns, MD

## 2024-01-23 ENCOUNTER — Encounter: Payer: Self-pay | Admitting: Oncology

## 2024-01-23 ENCOUNTER — Other Ambulatory Visit: Payer: Self-pay | Admitting: Oncology

## 2024-01-23 ENCOUNTER — Inpatient Hospital Stay: Attending: Hematology | Admitting: Oncology

## 2024-01-23 VITALS — BP 187/91 | HR 80 | Temp 98.1°F | Resp 14 | Ht 64.0 in | Wt 142.4 lb

## 2024-01-23 DIAGNOSIS — Z5112 Encounter for antineoplastic immunotherapy: Secondary | ICD-10-CM | POA: Insufficient documentation

## 2024-01-23 DIAGNOSIS — C50411 Malignant neoplasm of upper-outer quadrant of right female breast: Secondary | ICD-10-CM | POA: Diagnosis present

## 2024-01-23 DIAGNOSIS — Z1722 Progesterone receptor negative status: Secondary | ICD-10-CM | POA: Diagnosis not present

## 2024-01-23 DIAGNOSIS — Z1731 Human epidermal growth factor receptor 2 positive status: Secondary | ICD-10-CM | POA: Insufficient documentation

## 2024-01-23 DIAGNOSIS — Z170 Estrogen receptor positive status [ER+]: Secondary | ICD-10-CM | POA: Insufficient documentation

## 2024-01-23 NOTE — Progress Notes (Signed)
 Face to face contact with pt in [facility]. Pt has completed her surgery and is coming her for her Medical Oncology consult with Dr. Ezzard. Navigation contact info given.

## 2024-01-24 ENCOUNTER — Other Ambulatory Visit: Payer: Self-pay

## 2024-01-29 ENCOUNTER — Encounter: Payer: Self-pay | Admitting: *Deleted

## 2024-01-30 ENCOUNTER — Encounter: Payer: Self-pay | Admitting: Oncology

## 2024-01-31 ENCOUNTER — Ambulatory Visit

## 2024-01-31 ENCOUNTER — Other Ambulatory Visit: Payer: Self-pay

## 2024-01-31 ENCOUNTER — Inpatient Hospital Stay

## 2024-01-31 ENCOUNTER — Other Ambulatory Visit: Payer: Self-pay | Admitting: Pharmacist

## 2024-01-31 ENCOUNTER — Encounter: Payer: Self-pay | Admitting: Oncology

## 2024-01-31 VITALS — BP 156/93 | HR 84 | Temp 97.9°F | Resp 18 | Ht 64.0 in | Wt 142.1 lb

## 2024-01-31 DIAGNOSIS — Z5112 Encounter for antineoplastic immunotherapy: Secondary | ICD-10-CM | POA: Diagnosis not present

## 2024-01-31 DIAGNOSIS — Z170 Estrogen receptor positive status [ER+]: Secondary | ICD-10-CM

## 2024-01-31 LAB — CMP (CANCER CENTER ONLY)
ALT: 7 U/L (ref 0–44)
AST: 15 U/L (ref 15–41)
Albumin: 3.9 g/dL (ref 3.5–5.0)
Alkaline Phosphatase: 76 U/L (ref 38–126)
Anion gap: 12 (ref 5–15)
BUN: 10 mg/dL (ref 8–23)
CO2: 24 mmol/L (ref 22–32)
Calcium: 9.5 mg/dL (ref 8.9–10.3)
Chloride: 101 mmol/L (ref 98–111)
Creatinine: 0.68 mg/dL (ref 0.44–1.00)
GFR, Estimated: >60 mL/min (ref >60)
Glucose, Bld: 179 mg/dL — ABNORMAL HIGH (ref 70–99)
Potassium: 3.7 mmol/L (ref 3.5–5.1)
Sodium: 137 mmol/L (ref 135–145)
Total Bilirubin: 0.4 mg/dL (ref 0.0–1.2)
Total Protein: 6.4 g/dL — ABNORMAL LOW (ref 6.5–8.1)

## 2024-01-31 LAB — CBC WITH DIFFERENTIAL (CANCER CENTER ONLY)
Abs Immature Granulocytes: 0.04 K/uL (ref 0.00–0.07)
Basophils Absolute: 0.1 K/uL (ref 0.0–0.1)
Basophils Relative: 1 %
Eosinophils Absolute: 0.1 K/uL (ref 0.0–0.5)
Eosinophils Relative: 1 %
HCT: 37.5 % (ref 36.0–46.0)
Hemoglobin: 12.5 g/dL (ref 12.0–15.0)
Immature Granulocytes: 1 %
Immature Platelet Fraction: 2.7 % (ref 1.2–8.6)
Lymphocytes Relative: 24 %
Lymphs Abs: 1.8 K/uL (ref 0.7–4.0)
MCH: 28.4 pg (ref 26.0–34.0)
MCHC: 33.3 g/dL (ref 30.0–36.0)
MCV: 85.2 fL (ref 80.0–100.0)
Monocytes Absolute: 0.8 K/uL (ref 0.1–1.0)
Monocytes Relative: 10 %
Neutro Abs: 4.8 K/uL (ref 1.7–7.7)
Neutrophils Relative %: 63 %
Platelet Count: 210 K/uL (ref 150–400)
RBC: 4.4 MIL/uL (ref 3.87–5.11)
RDW: 14.2 % (ref 11.5–15.5)
WBC Count: 7.6 K/uL (ref 4.0–10.5)
nRBC: 0 % (ref 0.0–0.2)

## 2024-01-31 LAB — MAGNESIUM: Magnesium: 1.8 mg/dL (ref 1.7–2.4)

## 2024-01-31 MED ORDER — ACETAMINOPHEN 325 MG PO TABS
650.0000 mg | ORAL_TABLET | Freq: Once | ORAL | Status: AC
  Administered 2024-01-31: 650 mg via ORAL
  Filled 2024-01-31: qty 2

## 2024-01-31 MED ORDER — PERTUZ-TRASTUZ-HYALURON-ZZXF 80-40-2000 MG-MG-U/ML CHEMO ~~LOC~~ SOLN
15.0000 mL | Freq: Once | SUBCUTANEOUS | Status: AC
  Administered 2024-01-31: 1200 mg via SUBCUTANEOUS
  Filled 2024-01-31: qty 15

## 2024-01-31 MED ORDER — ONDANSETRON HCL 8 MG PO TABS: 8.0000 mg | ORAL_TABLET | Freq: Three times a day (TID) | ORAL | 1 refills | Status: AC | PRN

## 2024-01-31 MED ORDER — PROCHLORPERAZINE MALEATE 10 MG PO TABS: 10.0000 mg | ORAL_TABLET | Freq: Four times a day (QID) | ORAL | 1 refills | Status: DC | PRN

## 2024-01-31 MED ORDER — DIPHENHYDRAMINE HCL 25 MG PO CAPS
50.0000 mg | ORAL_CAPSULE | Freq: Once | ORAL | Status: AC
  Administered 2024-01-31: 50 mg via ORAL
  Filled 2024-01-31: qty 2

## 2024-01-31 NOTE — Progress Notes (Signed)
 This information is for discussion at tumor board only and not part of the official treatment plan.

## 2024-01-31 NOTE — Progress Notes (Signed)
..  Pharmacist Chemotherapy Monitoring - Initial Assessment    Anticipated start date: 01/31/2024  ECHO AT [facility] 01/28/2024 EF=50-55% GLS -14.6%  The following has been reviewed per standard work regarding the patient's treatment regimen: The patient's diagnosis, treatment plan and drug doses, and organ/hematologic function Lab orders and baseline tests specific to treatment regimen  The treatment plan start date, drug sequencing, and pre-medications Prior authorization status  Patient's documented medication list, including drug-drug interaction screen and prescriptions for anti-emetics and supportive care specific to the treatment regimen The drug concentrations, fluid compatibility, administration routes, and timing of the medications to be used The patient's access for treatment and lifetime cumulative dose history, if applicable  The patient's medication allergies and previous infusion related reactions, if applicable   Changes made to treatment plan:  N/A  Follow up needed:  N/A   [name], [facility], 01/31/2024  8:31 AM

## 2024-01-31 NOTE — Patient Instructions (Signed)
Pertuzumab; Trastuzumab; Hyaluronidase Injection What is this medication? PERTUZUMAB; TRASTUZUMAB; HYALURONIDASE (per TOOZ ue mab; tras TOO zoo mab; hye al ur ON i dase) treats breast cancer. Pertuzumab and trastuzumab work by blocking a protein that causes cancer cells to grow and multiply. This helps to slow or stop the spread of cancer cells. Hyaluronidase works by increasing the absorption of other medications in the body to help them work better. It is a combination medication that contains two monoclonal antibodies. This medicine may be used for other purposes; ask your health care provider or pharmacist if you have questions. COMMON BRAND NAME(S): PHESGO What should I tell my care team before I take this medication? They need to know if you have any of these conditions: Heart failure High blood pressure Irregular heartbeat or rhythm Lung disease An unusual or allergic reaction to pertuzumab, trastuzumab, hyaluronidase, other medications, foods, dyes, or preservatives Pregnant or trying to get pregnant Breast-feeding How should I use this medication? This medication is injected under the skin. It is usually given by your care team in a hospital or clinic setting. It may also be given at home by your care team. Talk to your care team about the use of this medication in children. Special care may be needed. Overdosage: If you think you have taken too much of this medicine contact a poison control center or emergency room at once. NOTE: This medicine is only for you. Do not share this medicine with others. What if I miss a dose? Keep appointments for follow-up doses. It is important not to miss your dose. Call your care team if you are unable to keep an appointment. What may interact with this medication? Certain types of chemotherapy, such as daunorubicin, doxorubicin, epirubicin, idarubicin This list may not describe all possible interactions. Give your health care provider a list of all  the medicines, herbs, non-prescription drugs, or dietary supplements you use. Also tell them if you smoke, drink alcohol, or use illegal drugs. Some items may interact with your medicine. What should I watch for while using this medication? Your condition will be monitored carefully while you are receiving this medication. This medication may make you feel generally unwell. This is not uncommon as chemotherapy can affect healthy cells as well as cancer cells. Report any side effects. Continue your course of treatment even though you feel ill unless your care team tells you to stop. This medication may increase your risk of getting an infection. Call your care team for advice if you get a fever, chills, sore throat, or other symptoms of a cold or flu. Do not treat yourself. Try to avoid being around people who are sick. Avoid taking medications that contain aspirin, acetaminophen, ibuprofen, naproxen, or ketoprofen unless instructed by your care team. These medications may hide a fever. Talk to your care team if you may be pregnant. Serious birth defects can occur if you take this medication during pregnancy and for 7 months after the last dose. You will need a negative pregnancy test before starting this medication. Contraception is recommended while taking this medication and for 7 months after the last dose. Your care team can help you find the option that works for you. Do not breastfeed while taking this medication and for 7 months after the last dose. What side effects may I notice from receiving this medication? Side effects that you should report to your care team as soon as possible: Allergic reactions or angioedema--skin rash, itching or hives, swelling of the  face, eyes, lips, tongue, arms, or legs, trouble swallowing or breathing Dry cough, shortness of breath or trouble breathing Heart failure--shortness of breath, swelling of the ankles, feet, or hands, sudden weight gain, unusual weakness  or fatigue Infection--fever, chills, cough, or sore throat Infusion reactions--chest pain, shortness of breath or trouble breathing, feeling faint or lightheaded Side effects that usually do not require medical attention (report these to your care team if they continue or are bothersome): Diarrhea Hair loss Nausea Pain, redness, or irritation at injection site Pain, redness, or swelling with sores inside the mouth or throat Unusual weakness or fatigue This list may not describe all possible side effects. Call your doctor for medical advice about side effects. You may report side effects to FDA at 1-800-FDA-1088. Where should I keep my medication? This medication is given in a hospital or clinic. It will not be stored at home. NOTE: This sheet is a summary. It may not cover all possible information. If you have questions about this medicine, talk to your doctor, pharmacist, or health care provider.  2024 Elsevier/Gold Standard (2021-11-25 00:00:00)

## 2024-02-01 ENCOUNTER — Telehealth: Payer: Self-pay

## 2024-02-01 NOTE — Telephone Encounter (Signed)
 I attempted call to see how pt is feeling today after 1st Phesgo  injection on 01/31/2024, no answer. Any side effects - N/V, cough, SOB, chest pain, dizziness, skin rash, itching, fever, chills, or diarrhea?  Remind pt to call if develops temp of 100.4 or higher, day or night.

## 2024-02-04 ENCOUNTER — Encounter: Payer: Self-pay | Admitting: Oncology

## 2024-02-04 NOTE — Telephone Encounter (Signed)
 I spoke with pt today after 1st Phesgo  injection on 01/31/2024. The first night she was really sleepy and vomited once after eating. No further vomiting. She had a couple episodes of soft loose BM, but not diarrhea. She admits to fatigue, but she still can do her cleaning, and washing. She denies cough, SOB, chest pain, dizziness, skin rash, itching, fever, and chills.  I reminded pt to call if develops temp of 100.4 or higher, day or night. She verbalized understanding.

## 2024-02-13 ENCOUNTER — Other Ambulatory Visit: Payer: Self-pay | Admitting: Oncology

## 2024-02-13 DIAGNOSIS — Z170 Estrogen receptor positive status [ER+]: Secondary | ICD-10-CM

## 2024-02-17 ENCOUNTER — Other Ambulatory Visit: Payer: Self-pay | Admitting: Oncology

## 2024-02-17 DIAGNOSIS — C50411 Malignant neoplasm of upper-outer quadrant of right female breast: Secondary | ICD-10-CM

## 2024-02-18 ENCOUNTER — Encounter: Payer: Self-pay | Admitting: Oncology

## 2024-02-19 ENCOUNTER — Encounter: Payer: Self-pay | Admitting: Oncology

## 2024-02-21 ENCOUNTER — Inpatient Hospital Stay

## 2024-02-21 VITALS — BP 149/78 | HR 98 | Temp 97.9°F | Resp 18 | Wt 138.0 lb

## 2024-02-21 DIAGNOSIS — C50411 Malignant neoplasm of upper-outer quadrant of right female breast: Secondary | ICD-10-CM

## 2024-02-21 DIAGNOSIS — Z5112 Encounter for antineoplastic immunotherapy: Secondary | ICD-10-CM | POA: Diagnosis not present

## 2024-02-21 LAB — CMP (CANCER CENTER ONLY)
ALT: 9 U/L (ref 0–44)
AST: 19 U/L (ref 15–41)
Albumin: 3.9 g/dL (ref 3.5–5.0)
Alkaline Phosphatase: 81 U/L (ref 38–126)
Anion gap: 12 (ref 5–15)
BUN: 9 mg/dL (ref 8–23)
CO2: 23 mmol/L (ref 22–32)
Calcium: 9.3 mg/dL (ref 8.9–10.3)
Chloride: 101 mmol/L (ref 98–111)
Creatinine: 0.6 mg/dL (ref 0.44–1.00)
GFR, Estimated: >60 mL/min (ref >60)
Glucose, Bld: 172 mg/dL — ABNORMAL HIGH (ref 70–99)
Potassium: 3.5 mmol/L (ref 3.5–5.1)
Sodium: 135 mmol/L (ref 135–145)
Total Bilirubin: 0.4 mg/dL (ref 0.0–1.2)
Total Protein: 6.3 g/dL — ABNORMAL LOW (ref 6.5–8.1)

## 2024-02-21 LAB — CBC WITH DIFFERENTIAL (CANCER CENTER ONLY)
Abs Immature Granulocytes: 0.03 K/uL (ref 0.00–0.07)
Basophils Absolute: 0.1 K/uL (ref 0.0–0.1)
Basophils Relative: 1 %
Eosinophils Absolute: 0.1 K/uL (ref 0.0–0.5)
Eosinophils Relative: 2 %
HCT: 36.9 % (ref 36.0–46.0)
Hemoglobin: 12.3 g/dL (ref 12.0–15.0)
Immature Granulocytes: 1 %
Lymphocytes Relative: 27 %
Lymphs Abs: 1.8 K/uL (ref 0.7–4.0)
MCH: 28.2 pg (ref 26.0–34.0)
MCHC: 33.3 g/dL (ref 30.0–36.0)
MCV: 84.6 fL (ref 80.0–100.0)
Monocytes Absolute: 0.6 K/uL (ref 0.1–1.0)
Monocytes Relative: 9 %
Neutro Abs: 4 K/uL (ref 1.7–7.7)
Neutrophils Relative %: 60 %
Platelet Count: 196 K/uL (ref 150–400)
RBC: 4.36 MIL/uL (ref 3.87–5.11)
RDW: 13.8 % (ref 11.5–15.5)
WBC Count: 6.6 K/uL (ref 4.0–10.5)
nRBC: 0 % (ref 0.0–0.2)

## 2024-02-21 LAB — MAGNESIUM: Magnesium: 1.7 mg/dL (ref 1.7–2.4)

## 2024-02-21 MED ORDER — PERTUZ-TRASTUZ-HYALURON-ZZXF 60-60-2000 MG-MG-U/ML CHEMO ~~LOC~~ SOLN
10.0000 mL | Freq: Once | SUBCUTANEOUS | Status: AC
  Administered 2024-02-21: 600 mg via SUBCUTANEOUS
  Filled 2024-02-21: qty 10

## 2024-02-21 MED ORDER — DIPHENHYDRAMINE HCL 25 MG PO CAPS
50.0000 mg | ORAL_CAPSULE | Freq: Once | ORAL | Status: AC
  Administered 2024-02-21: 50 mg via ORAL
  Filled 2024-02-21: qty 2

## 2024-02-21 MED ORDER — ACETAMINOPHEN 325 MG PO TABS
650.0000 mg | ORAL_TABLET | Freq: Once | ORAL | Status: AC
  Administered 2024-02-21: 650 mg via ORAL
  Filled 2024-02-21: qty 2

## 2024-02-21 NOTE — Patient Instructions (Signed)
Pertuzumab; Trastuzumab; Hyaluronidase Injection What is this medication? PERTUZUMAB; TRASTUZUMAB; HYALURONIDASE (per TOOZ ue mab; tras TOO zoo mab; hye al ur ON i dase) treats breast cancer. Pertuzumab and trastuzumab work by blocking a protein that causes cancer cells to grow and multiply. This helps to slow or stop the spread of cancer cells. Hyaluronidase works by increasing the absorption of other medications in the body to help them work better. It is a combination medication that contains two monoclonal antibodies. This medicine may be used for other purposes; ask your health care provider or pharmacist if you have questions. COMMON BRAND NAME(S): PHESGO What should I tell my care team before I take this medication? They need to know if you have any of these conditions: Heart failure High blood pressure Irregular heartbeat or rhythm Lung disease An unusual or allergic reaction to pertuzumab, trastuzumab, hyaluronidase, other medications, foods, dyes, or preservatives Pregnant or trying to get pregnant Breast-feeding How should I use this medication? This medication is injected under the skin. It is usually given by your care team in a hospital or clinic setting. It may also be given at home by your care team. Talk to your care team about the use of this medication in children. Special care may be needed. Overdosage: If you think you have taken too much of this medicine contact a poison control center or emergency room at once. NOTE: This medicine is only for you. Do not share this medicine with others. What if I miss a dose? Keep appointments for follow-up doses. It is important not to miss your dose. Call your care team if you are unable to keep an appointment. What may interact with this medication? Certain types of chemotherapy, such as daunorubicin, doxorubicin, epirubicin, idarubicin This list may not describe all possible interactions. Give your health care provider a list of all  the medicines, herbs, non-prescription drugs, or dietary supplements you use. Also tell them if you smoke, drink alcohol, or use illegal drugs. Some items may interact with your medicine. What should I watch for while using this medication? Your condition will be monitored carefully while you are receiving this medication. This medication may make you feel generally unwell. This is not uncommon as chemotherapy can affect healthy cells as well as cancer cells. Report any side effects. Continue your course of treatment even though you feel ill unless your care team tells you to stop. This medication may increase your risk of getting an infection. Call your care team for advice if you get a fever, chills, sore throat, or other symptoms of a cold or flu. Do not treat yourself. Try to avoid being around people who are sick. Avoid taking medications that contain aspirin, acetaminophen, ibuprofen, naproxen, or ketoprofen unless instructed by your care team. These medications may hide a fever. Talk to your care team if you may be pregnant. Serious birth defects can occur if you take this medication during pregnancy and for 7 months after the last dose. You will need a negative pregnancy test before starting this medication. Contraception is recommended while taking this medication and for 7 months after the last dose. Your care team can help you find the option that works for you. Do not breastfeed while taking this medication and for 7 months after the last dose. What side effects may I notice from receiving this medication? Side effects that you should report to your care team as soon as possible: Allergic reactions or angioedema--skin rash, itching or hives, swelling of the  face, eyes, lips, tongue, arms, or legs, trouble swallowing or breathing Dry cough, shortness of breath or trouble breathing Heart failure--shortness of breath, swelling of the ankles, feet, or hands, sudden weight gain, unusual weakness  or fatigue Infection--fever, chills, cough, or sore throat Infusion reactions--chest pain, shortness of breath or trouble breathing, feeling faint or lightheaded Side effects that usually do not require medical attention (report these to your care team if they continue or are bothersome): Diarrhea Hair loss Nausea Pain, redness, or irritation at injection site Pain, redness, or swelling with sores inside the mouth or throat Unusual weakness or fatigue This list may not describe all possible side effects. Call your doctor for medical advice about side effects. You may report side effects to FDA at 1-800-FDA-1088. Where should I keep my medication? This medication is given in a hospital or clinic. It will not be stored at home. NOTE: This sheet is a summary. It may not cover all possible information. If you have questions about this medicine, talk to your doctor, pharmacist, or health care provider.  2024 Elsevier/Gold Standard (2021-11-25 00:00:00)

## 2024-03-02 ENCOUNTER — Other Ambulatory Visit: Payer: Self-pay

## 2024-03-03 ENCOUNTER — Other Ambulatory Visit: Payer: Self-pay

## 2024-03-12 NOTE — Progress Notes (Unsigned)
 `` [facility] [address] [place],  [place]  [address] [phone]  Clinic Day:  03/13/2024  Referring physician: Pia Kerney SQUIBB, MD   HISTORY OF PRESENT ILLNESS:  [name] is a 83 y.o. 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   As she did not want chemotherapy or a port placed, she has been receiving SQ Phesgo  (trastuzumab/pertuzumab ) every 3 weeks in conjunction with letrozole  for her adjuvant breast cancer management.  She comes in today to be evaluated before heading into her 3rd cycle of Phesgo .  Thus far, she has tolerated her Phesgo  shots fairly well.  Of note, the patient claims she has yet to be prescribed letrozole .  She denies having any changes in her breast which concern her for early disease recurrence.  PHYSICAL EXAM:  Blood pressure 117/73, pulse 79, temperature 98.4 F (36.9 C), temperature source Oral, resp. rate 14, height 5' 4 (1.626 m), weight 132 lb 12.8 oz (60.2 kg), SpO2 98%. Wt Readings from Last 3 Encounters:  03/13/24 132 lb 12.8 oz (60.2 kg)  02/21/24 138 lb (62.6 kg)  01/31/24 142 lb 1.9 oz (64.5 kg)   Body mass index is 22.8 kg/m. Performance status (ECOG): 1 - Symptomatic but completely ambulatory Physical Exam Constitutional:      Appearance: Normal appearance.  HENT:     Mouth/Throat:     Pharynx: Oropharynx is clear. No oropharyngeal exudate.  Cardiovascular:     Rate and Rhythm: Normal rate and regular rhythm.     Heart sounds: No murmur heard.    No friction rub. No gallop.  Pulmonary:     Breath sounds: Normal breath sounds.  Chest:  Breasts:    Right: No swelling, bleeding, inverted nipple, mass, nipple discharge or skin change.     Left: No swelling, bleeding, inverted nipple, mass, nipple discharge or skin change.     Comments: Her right breast is healing well from both her lumpectomy and sentinel lymph node  biopsy. Abdominal:     General: Bowel sounds are normal. There is no distension.     Palpations: Abdomen is soft. There is no mass.     Tenderness: There is no abdominal tenderness.  Musculoskeletal:        General: No tenderness.     Cervical back: Normal range of motion and neck supple.     Right lower leg: No edema.     Left lower leg: No edema.  Lymphadenopathy:     Cervical: No cervical adenopathy.     Right cervical: No superficial, deep or posterior cervical adenopathy.    Left cervical: No superficial, deep or posterior cervical adenopathy.     Upper Body:     Right upper body: No supraclavicular or axillary adenopathy.     Left upper body: No supraclavicular or axillary adenopathy.     Lower Body: No right inguinal adenopathy. No left inguinal adenopathy.  Skin:    Coloration: Skin is not jaundiced.     Findings: No lesion or rash.  Neurological:     General: No focal deficit present.     Mental Status: She is alert and oriented to person, place, and time. Mental status is at baseline.  Psychiatric:        Mood and Affect: Mood normal.        Behavior: Behavior normal.        Thought Content:  Thought content normal.        Judgment: Judgment normal.    LABS:      Latest Ref Rng & Units 03/13/2024    1:09 PM 02/21/2024    1:29 PM 01/31/2024    9:31 AM  CBC  WBC 4.0 - 10.5 K/uL 5.6  6.6  7.6   Hemoglobin 12.0 - 15.0 g/dL 88.0  87.6  87.4   Hematocrit 36.0 - 46.0 % 35.6  36.9  37.5   Platelets 150 - 400 K/uL 181  196  210       Latest Ref Rng & Units 03/13/2024    1:09 PM 02/21/2024    1:29 PM 01/31/2024    9:31 AM  CMP  Glucose 70 - 99 mg/dL 891  827  820   BUN 8 - 23 mg/dL 12  9  10    Creatinine 0.44 - 1.00 mg/dL 9.37  9.39  9.31   Sodium 135 - 145 mmol/L 140  135  137   Potassium 3.5 - 5.1 mmol/L 4.0  3.5  3.7   Chloride 98 - 111 mmol/L 103  101  101   CO2 22 - 32 mmol/L 26  23  24    Calcium 8.9 - 10.3 mg/dL 9.4  9.3  9.5   Total Protein 6.5 - 8.1 g/dL 6.6   6.3  6.4   Total Bilirubin 0.0 - 1.2 mg/dL 0.4  0.4  0.4   Alkaline Phos 38 - 126 U/L 73  81  76   AST 15 - 41 U/L 18  19  15    ALT 0 - 44 U/L 10  9  7      STUDIES:  Her breast cancer pathology revealed the following:  FINAL MICROSCOPIC DIAGNOSIS:  A. LYMPH NODE, RIGHT AXILLARY #1, SENTINEL, EXCISION: - One lymph node negative for malignancy (0/1). - See cancer summary below.  B. LYMPH NODE, RIGHT AXILLARY #2, SENTINEL, EXCISION: - One lymph node negative for malignancy (0/1). - See cancer summary below. - A pancytokeratin stain was examined due areas of marked thermal artifact.  C. 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 - See cancer summary below. - Biopsy site with coil clip. - Unremarkable skin.  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 Histologic Grade (Nottingham Histologic Score)      Glandular (Acinar)/Tubular Differentiation: 3      Nuclear Pleomorphism: 3      Mitotic Rate: 3      Overall Grade: 3 Tumor Size: Greatest dimension of largest invasive focus: 35 mm Ductal Carcinoma In Situ (DCIS): Not identified Tumor Extent: Not applicable Lymphatic and/or Vascular Invasion: Not identified Treatment Effect in the Breast: No known presurgical therapy  MARGINS Margin Status for Invasive Carcinoma: All margins negative for invasive carcinoma      Distance from closest margin: 11 mm      Specify closest margin: Posterior  Margin Status for DCIS / PLCIS: All margins negative for DCIS / PLCIS      Distance from DCIS to closest margin: 2 mm      Specify closest margin: Superior  REGIONAL LYMPH NODES Regional Lymph Node Status: All regional lymph nodes  negative for tumor      Total Number of Lymph Nodes Examined (sentinel and non-sentinel):  2       Number of Sentinel Nodes Examined: 2  DISTANT METASTASIS Distant Site(s) Involved, if applicable: Not applicable  PATHOLOGIC STAGE CLASSIFICATION (pTNM, AJCC 8th Edition): Modified Classification: Not applicable pT Category: pT2      T Suffix: Not applicable pN Category: pN0      N Suffix: (sn) pM Category: Not applicable  ADDENDUM:  PROGNOSTIC INDICATOR RESULTS (REPEAT):   Immunohistochemical and morphometric analysis performed manually.   The tumor cells are POSITIVE for Her2 (3+).   Estrogen Receptor:       POSITIVE, 35%, MOERATE TO STRONG STAINING  Progesterone Receptor:   NEGATIVE (0)  Proliferation Marker Ki-67:   20%    ASSESSMENT & PLAN:   [name] is an 83 y.o. 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  She will proceed with her third cycle of Phesgo  today, which is a subcutaneous injection that contains both Herceptin and Perjeta . Both agents are used to offset the activity/proliferation of HER2 positive breast cancer.  This injection will continue to be given once every 3 weeks to complete 1 full year of anti-HER2 therapy.  I did send in her prescription today for letrozole , which she will take daily for 5 years.  Clinically, the patient is doing well.  I will see her back in 9 weeks before she heads into her 6th cycle of Phesgo .  The patient understands all the plans discussed today and is in agreement with them.  Zyana Amaro DELENA Kerns, MD

## 2024-03-13 ENCOUNTER — Inpatient Hospital Stay

## 2024-03-13 ENCOUNTER — Inpatient Hospital Stay: Attending: Hematology | Admitting: Oncology

## 2024-03-13 ENCOUNTER — Other Ambulatory Visit: Payer: Self-pay | Admitting: Oncology

## 2024-03-13 ENCOUNTER — Telehealth: Payer: Self-pay | Admitting: Oncology

## 2024-03-13 VITALS — BP 117/73 | HR 79 | Temp 98.4°F | Resp 14 | Ht 64.0 in | Wt 132.8 lb

## 2024-03-13 DIAGNOSIS — C50411 Malignant neoplasm of upper-outer quadrant of right female breast: Secondary | ICD-10-CM | POA: Insufficient documentation

## 2024-03-13 DIAGNOSIS — Z1722 Progesterone receptor negative status: Secondary | ICD-10-CM | POA: Diagnosis not present

## 2024-03-13 DIAGNOSIS — Z1731 Human epidermal growth factor receptor 2 positive status: Secondary | ICD-10-CM | POA: Insufficient documentation

## 2024-03-13 DIAGNOSIS — Z5112 Encounter for antineoplastic immunotherapy: Secondary | ICD-10-CM | POA: Diagnosis present

## 2024-03-13 DIAGNOSIS — Z170 Estrogen receptor positive status [ER+]: Secondary | ICD-10-CM

## 2024-03-13 DIAGNOSIS — Z79811 Long term (current) use of aromatase inhibitors: Secondary | ICD-10-CM | POA: Diagnosis not present

## 2024-03-13 LAB — CMP (CANCER CENTER ONLY)
ALT: 10 U/L (ref 0–44)
AST: 18 U/L (ref 15–41)
Albumin: 4.1 g/dL (ref 3.5–5.0)
Alkaline Phosphatase: 73 U/L (ref 38–126)
Anion gap: 12 (ref 5–15)
BUN: 12 mg/dL (ref 8–23)
CO2: 26 mmol/L (ref 22–32)
Calcium: 9.4 mg/dL (ref 8.9–10.3)
Chloride: 103 mmol/L (ref 98–111)
Creatinine: 0.62 mg/dL (ref 0.44–1.00)
GFR, Estimated: >60 mL/min (ref >60)
Glucose, Bld: 108 mg/dL — ABNORMAL HIGH (ref 70–99)
Potassium: 4 mmol/L (ref 3.5–5.1)
Sodium: 140 mmol/L (ref 135–145)
Total Bilirubin: 0.4 mg/dL (ref 0.0–1.2)
Total Protein: 6.6 g/dL (ref 6.5–8.1)

## 2024-03-13 LAB — CBC WITH DIFFERENTIAL (CANCER CENTER ONLY)
Abs Immature Granulocytes: 0.01 K/uL (ref 0.00–0.07)
Basophils Absolute: 0.1 K/uL (ref 0.0–0.1)
Basophils Relative: 1 %
Eosinophils Absolute: 0.1 K/uL (ref 0.0–0.5)
Eosinophils Relative: 1 %
HCT: 35.6 % — ABNORMAL LOW (ref 36.0–46.0)
Hemoglobin: 11.9 g/dL — ABNORMAL LOW (ref 12.0–15.0)
Immature Granulocytes: 0 %
Lymphocytes Relative: 26 %
Lymphs Abs: 1.4 K/uL (ref 0.7–4.0)
MCH: 28.7 pg (ref 26.0–34.0)
MCHC: 33.4 g/dL (ref 30.0–36.0)
MCV: 85.8 fL (ref 80.0–100.0)
Monocytes Absolute: 0.6 K/uL (ref 0.1–1.0)
Monocytes Relative: 12 %
Neutro Abs: 3.4 K/uL (ref 1.7–7.7)
Neutrophils Relative %: 60 %
Platelet Count: 181 K/uL (ref 150–400)
RBC: 4.15 MIL/uL (ref 3.87–5.11)
RDW: 14 % (ref 11.5–15.5)
WBC Count: 5.6 K/uL (ref 4.0–10.5)
nRBC: 0 % (ref 0.0–0.2)

## 2024-03-13 MED ORDER — ACETAMINOPHEN 325 MG PO TABS
650.0000 mg | ORAL_TABLET | Freq: Once | ORAL | Status: AC
  Administered 2024-03-13: 650 mg via ORAL
  Filled 2024-03-13: qty 2

## 2024-03-13 MED ORDER — LETROZOLE 2.5 MG PO TABS: 2.5000 mg | ORAL_TABLET | Freq: Every day | ORAL | 3 refills | Status: DC

## 2024-03-13 MED ORDER — DIPHENHYDRAMINE HCL 25 MG PO CAPS
50.0000 mg | ORAL_CAPSULE | Freq: Once | ORAL | Status: AC
Start: 2024-03-13 — End: 2024-03-13
  Administered 2024-03-13: 50 mg via ORAL
  Filled 2024-03-13: qty 2

## 2024-03-13 MED ORDER — PERTUZ-TRASTUZ-HYALURON-ZZXF 60-60-2000 MG-MG-U/ML CHEMO ~~LOC~~ SOLN
10.0000 mL | Freq: Once | SUBCUTANEOUS | Status: AC
  Administered 2024-03-13: 600 mg via SUBCUTANEOUS
  Filled 2024-03-13: qty 10

## 2024-03-13 NOTE — Patient Instructions (Signed)
 Phesgo  Pertuzumab ; Trastuzumab; Hyaluronidase Injection What is this medication? PERTUZUMAB ; TRASTUZUMAB; HYALURONIDASE (per TOOZ ue mab; tras TOO zoo mab; hye al ur ON i dase) treats breast cancer. Pertuzumab  and trastuzumab work by blocking a protein that causes cancer cells to grow and multiply. This helps to slow or stop the spread of cancer cells. Hyaluronidase works by increasing the absorption of other medications in the body to help them work better. It is a combination medication that contains two monoclonal antibodies. This medicine may be used for other purposes; ask your health care provider or pharmacist if you have questions. COMMON BRAND NAME(S): PHESGO  What should I tell my care team before I take this medication? They need to know if you have any of these conditions: Heart failure High blood pressure Irregular heartbeat or rhythm Lung disease An unusual or allergic reaction to pertuzumab , trastuzumab, hyaluronidase, other medications, foods, dyes, or preservatives Pregnant or trying to get pregnant Breast-feeding How should I use this medication? This medication is injected under the skin. It is usually given by your care team in a hospital or clinic setting. It may also be given at home by your care team. Talk to your care team about the use of this medication in children. Special care may be needed. Overdosage: If you think you have taken too much of this medicine contact a poison control center or emergency room at once. NOTE: This medicine is only for you. Do not share this medicine with others. What if I miss a dose? Keep appointments for follow-up doses. It is important not to miss your dose. Call your care team if you are unable to keep an appointment. What may interact with this medication? Certain types of chemotherapy, such as daunorubicin, doxorubicin, epirubicin, idarubicin This list may not describe all possible interactions. Give your health care provider a list  of all the medicines, herbs, non-prescription drugs, or dietary supplements you use. Also tell them if you smoke, drink alcohol, or use illegal drugs. Some items may interact with your medicine. What should I watch for while using this medication? Your condition will be monitored carefully while you are receiving this medication. This medication may make you feel generally unwell. This is not uncommon as chemotherapy can affect healthy cells as well as cancer cells. Report any side effects. Continue your course of treatment even though you feel ill unless your care team tells you to stop. This medication may increase your risk of getting an infection. Call your care team for advice if you get a fever, chills, sore throat, or other symptoms of a cold or flu. Do not treat yourself. Try to avoid being around people who are sick. Avoid taking medications that contain aspirin, acetaminophen , ibuprofen, naproxen, or ketoprofen unless instructed by your care team. These medications may hide a fever. Talk to your care team if you may be pregnant. Serious birth defects can occur if you take this medication during pregnancy and for 7 months after the last dose. You will need a negative pregnancy test before starting this medication. Contraception is recommended while taking this medication and for 7 months after the last dose. Your care team can help you find the option that works for you. Do not breastfeed while taking this medication and for 7 months after the last dose. What side effects may I notice from receiving this medication? Side effects that you should report to your care team as soon as possible: Allergic reactions or angioedema--skin rash, itching or hives, swelling of  the face, eyes, lips, tongue, arms, or legs, trouble swallowing or breathing Dry cough, shortness of breath or trouble breathing Heart failure--shortness of breath, swelling of the ankles, feet, or hands, sudden weight gain, unusual  weakness or fatigue Infection--fever, chills, cough, or sore throat Infusion reactions--chest pain, shortness of breath or trouble breathing, feeling faint or lightheaded Side effects that usually do not require medical attention (report these to your care team if they continue or are bothersome): Diarrhea Hair loss Nausea Pain, redness, or irritation at injection site Pain, redness, or swelling with sores inside the mouth or throat Unusual weakness or fatigue This list may not describe all possible side effects. Call your doctor for medical advice about side effects. You may report side effects to FDA at 1-800-FDA-1088. Where should I keep my medication? This medication is given in a hospital or clinic. It will not be stored at home. NOTE: This sheet is a summary. It may not cover all possible information. If you have questions about this medicine, talk to your doctor, pharmacist, or health care provider.  2024 Elsevier/Gold Standard (2021-11-25 00:00:00)

## 2024-03-13 NOTE — Telephone Encounter (Signed)
 Patient has been scheduled for follow-up visit per 03/12/24 LOS.  Pt given an appt calendar with date and time.

## 2024-03-14 ENCOUNTER — Other Ambulatory Visit: Payer: Self-pay

## 2024-03-21 ENCOUNTER — Telehealth: Payer: Self-pay

## 2024-03-21 DIAGNOSIS — C50411 Malignant neoplasm of upper-outer quadrant of right female breast: Secondary | ICD-10-CM

## 2024-03-21 NOTE — Telephone Encounter (Signed)
 Pt called in stating, I can't take this letrozole . It is giving me bad reflux and abd pain. I took Pepto and it didn't help. So then I took 2 omeprazole, which helped some. Someone in background mentioned she has hx gastritis. She isn't willing to take it right now.

## 2024-03-31 ENCOUNTER — Encounter: Payer: Self-pay | Admitting: Oncology

## 2024-03-31 ENCOUNTER — Other Ambulatory Visit: Payer: Self-pay

## 2024-03-31 DIAGNOSIS — C50411 Malignant neoplasm of upper-outer quadrant of right female breast: Secondary | ICD-10-CM

## 2024-03-31 MED ORDER — ANASTROZOLE 1 MG PO TABS: 1.0000 mg | ORAL_TABLET | Freq: Every day | ORAL | 0 refills | Status: DC

## 2024-04-03 ENCOUNTER — Inpatient Hospital Stay

## 2024-04-03 ENCOUNTER — Inpatient Hospital Stay: Attending: Hematology

## 2024-04-03 VITALS — BP 162/87 | HR 80 | Temp 97.6°F | Resp 18

## 2024-04-03 DIAGNOSIS — C50411 Malignant neoplasm of upper-outer quadrant of right female breast: Secondary | ICD-10-CM | POA: Diagnosis present

## 2024-04-03 DIAGNOSIS — Z79811 Long term (current) use of aromatase inhibitors: Secondary | ICD-10-CM | POA: Diagnosis not present

## 2024-04-03 DIAGNOSIS — Z170 Estrogen receptor positive status [ER+]: Secondary | ICD-10-CM | POA: Diagnosis not present

## 2024-04-03 DIAGNOSIS — Z5111 Encounter for antineoplastic chemotherapy: Secondary | ICD-10-CM | POA: Diagnosis present

## 2024-04-03 DIAGNOSIS — Z1722 Progesterone receptor negative status: Secondary | ICD-10-CM | POA: Insufficient documentation

## 2024-04-03 DIAGNOSIS — Z1731 Human epidermal growth factor receptor 2 positive status: Secondary | ICD-10-CM | POA: Diagnosis not present

## 2024-04-03 LAB — CMP (CANCER CENTER ONLY)
ALT: 7 U/L (ref 0–44)
AST: 19 U/L (ref 15–41)
Albumin: 3.9 g/dL (ref 3.5–5.0)
Alkaline Phosphatase: 67 U/L (ref 38–126)
Anion gap: 12 (ref 5–15)
BUN: 14 mg/dL (ref 8–23)
CO2: 24 mmol/L (ref 22–32)
Calcium: 9.3 mg/dL (ref 8.9–10.3)
Chloride: 100 mmol/L (ref 98–111)
Creatinine: 0.6 mg/dL (ref 0.44–1.00)
GFR, Estimated: >60 mL/min (ref >60)
Glucose, Bld: 110 mg/dL — ABNORMAL HIGH (ref 70–99)
Potassium: 3.6 mmol/L (ref 3.5–5.1)
Sodium: 136 mmol/L (ref 135–145)
Total Bilirubin: 0.4 mg/dL (ref 0.0–1.2)
Total Protein: 6.6 g/dL (ref 6.5–8.1)

## 2024-04-03 LAB — CBC WITH DIFFERENTIAL (CANCER CENTER ONLY)
Abs Immature Granulocytes: 0.01 K/uL (ref 0.00–0.07)
Basophils Absolute: 0.1 K/uL (ref 0.0–0.1)
Basophils Relative: 1 %
Eosinophils Absolute: 0.1 K/uL (ref 0.0–0.5)
Eosinophils Relative: 1 %
HCT: 36.9 % (ref 36.0–46.0)
Hemoglobin: 12.4 g/dL (ref 12.0–15.0)
Immature Granulocytes: 0 %
Lymphocytes Relative: 29 %
Lymphs Abs: 1.5 K/uL (ref 0.7–4.0)
MCH: 28.2 pg (ref 26.0–34.0)
MCHC: 33.6 g/dL (ref 30.0–36.0)
MCV: 84.1 fL (ref 80.0–100.0)
Monocytes Absolute: 0.5 K/uL (ref 0.1–1.0)
Monocytes Relative: 10 %
Neutro Abs: 2.9 K/uL (ref 1.7–7.7)
Neutrophils Relative %: 59 %
Platelet Count: 183 K/uL (ref 150–400)
RBC: 4.39 MIL/uL (ref 3.87–5.11)
RDW: 13.8 % (ref 11.5–15.5)
WBC Count: 5 K/uL (ref 4.0–10.5)
nRBC: 0 % (ref 0.0–0.2)

## 2024-04-03 LAB — MAGNESIUM: Magnesium: 1.6 mg/dL — ABNORMAL LOW (ref 1.7–2.4)

## 2024-04-03 MED ORDER — ACETAMINOPHEN 325 MG PO TABS
650.0000 mg | ORAL_TABLET | Freq: Once | ORAL | Status: AC
  Administered 2024-04-03: 650 mg via ORAL
  Filled 2024-04-03: qty 2

## 2024-04-03 MED ORDER — DIPHENHYDRAMINE HCL 25 MG PO CAPS
50.0000 mg | ORAL_CAPSULE | Freq: Once | ORAL | Status: AC
  Administered 2024-04-03: 50 mg via ORAL
  Filled 2024-04-03: qty 2

## 2024-04-03 MED ORDER — PERTUZ-TRASTUZ-HYALURON-ZZXF 60-60-2000 MG-MG-U/ML CHEMO ~~LOC~~ SOLN
10.0000 mL | Freq: Once | SUBCUTANEOUS | Status: AC
  Administered 2024-04-03: 600 mg via SUBCUTANEOUS
  Filled 2024-04-03: qty 10

## 2024-04-03 NOTE — Progress Notes (Signed)
 [facility] in [facility]. Pt is here today for her targeted therapy injection. Pt reports that she has done well with the injections however, she was not able to tolerate  the aromastase inhibitor and is in the process of changing medications.

## 2024-04-03 NOTE — Patient Instructions (Signed)
Pertuzumab; Trastuzumab; Hyaluronidase Injection What is this medication? PERTUZUMAB; TRASTUZUMAB; HYALURONIDASE (per TOOZ ue mab; tras TOO zoo mab; hye al ur ON i dase) treats breast cancer. Pertuzumab and trastuzumab work by blocking a protein that causes cancer cells to grow and multiply. This helps to slow or stop the spread of cancer cells. Hyaluronidase works by increasing the absorption of other medications in the body to help them work better. It is a combination medication that contains two monoclonal antibodies. This medicine may be used for other purposes; ask your health care provider or pharmacist if you have questions. COMMON BRAND NAME(S): PHESGO What should I tell my care team before I take this medication? They need to know if you have any of these conditions: Heart failure High blood pressure Irregular heartbeat or rhythm Lung disease An unusual or allergic reaction to pertuzumab, trastuzumab, hyaluronidase, other medications, foods, dyes, or preservatives Pregnant or trying to get pregnant Breast-feeding How should I use this medication? This medication is injected under the skin. It is usually given by your care team in a hospital or clinic setting. It may also be given at home by your care team. Talk to your care team about the use of this medication in children. Special care may be needed. Overdosage: If you think you have taken too much of this medicine contact a poison control center or emergency room at once. NOTE: This medicine is only for you. Do not share this medicine with others. What if I miss a dose? Keep appointments for follow-up doses. It is important not to miss your dose. Call your care team if you are unable to keep an appointment. What may interact with this medication? Certain types of chemotherapy, such as daunorubicin, doxorubicin, epirubicin, idarubicin This list may not describe all possible interactions. Give your health care provider a list of all  the medicines, herbs, non-prescription drugs, or dietary supplements you use. Also tell them if you smoke, drink alcohol, or use illegal drugs. Some items may interact with your medicine. What should I watch for while using this medication? Your condition will be monitored carefully while you are receiving this medication. This medication may make you feel generally unwell. This is not uncommon as chemotherapy can affect healthy cells as well as cancer cells. Report any side effects. Continue your course of treatment even though you feel ill unless your care team tells you to stop. This medication may increase your risk of getting an infection. Call your care team for advice if you get a fever, chills, sore throat, or other symptoms of a cold or flu. Do not treat yourself. Try to avoid being around people who are sick. Avoid taking medications that contain aspirin, acetaminophen, ibuprofen, naproxen, or ketoprofen unless instructed by your care team. These medications may hide a fever. Talk to your care team if you may be pregnant. Serious birth defects can occur if you take this medication during pregnancy and for 7 months after the last dose. You will need a negative pregnancy test before starting this medication. Contraception is recommended while taking this medication and for 7 months after the last dose. Your care team can help you find the option that works for you. Do not breastfeed while taking this medication and for 7 months after the last dose. What side effects may I notice from receiving this medication? Side effects that you should report to your care team as soon as possible: Allergic reactions or angioedema--skin rash, itching or hives, swelling of the  face, eyes, lips, tongue, arms, or legs, trouble swallowing or breathing Dry cough, shortness of breath or trouble breathing Heart failure--shortness of breath, swelling of the ankles, feet, or hands, sudden weight gain, unusual weakness  or fatigue Infection--fever, chills, cough, or sore throat Infusion reactions--chest pain, shortness of breath or trouble breathing, feeling faint or lightheaded Side effects that usually do not require medical attention (report these to your care team if they continue or are bothersome): Diarrhea Hair loss Nausea Pain, redness, or irritation at injection site Pain, redness, or swelling with sores inside the mouth or throat Unusual weakness or fatigue This list may not describe all possible side effects. Call your doctor for medical advice about side effects. You may report side effects to FDA at 1-800-FDA-1088. Where should I keep my medication? This medication is given in a hospital or clinic. It will not be stored at home. NOTE: This sheet is a summary. It may not cover all possible information. If you have questions about this medicine, talk to your doctor, pharmacist, or health care provider.  2024 Elsevier/Gold Standard (2021-11-25 00:00:00)

## 2024-04-12 ENCOUNTER — Other Ambulatory Visit: Payer: Self-pay

## 2024-04-24 ENCOUNTER — Other Ambulatory Visit: Payer: Self-pay | Admitting: Hematology and Oncology

## 2024-04-24 ENCOUNTER — Telehealth: Payer: Self-pay

## 2024-04-24 ENCOUNTER — Inpatient Hospital Stay

## 2024-04-24 ENCOUNTER — Inpatient Hospital Stay: Attending: Hematology

## 2024-04-24 VITALS — BP 169/93 | HR 77 | Temp 97.5°F | Resp 18

## 2024-04-24 DIAGNOSIS — C50411 Malignant neoplasm of upper-outer quadrant of right female breast: Secondary | ICD-10-CM | POA: Diagnosis present

## 2024-04-24 DIAGNOSIS — Z5111 Encounter for antineoplastic chemotherapy: Secondary | ICD-10-CM | POA: Diagnosis present

## 2024-04-24 DIAGNOSIS — Z170 Estrogen receptor positive status [ER+]: Secondary | ICD-10-CM | POA: Diagnosis not present

## 2024-04-24 DIAGNOSIS — Z1731 Human epidermal growth factor receptor 2 positive status: Secondary | ICD-10-CM | POA: Insufficient documentation

## 2024-04-24 LAB — CMP (CANCER CENTER ONLY)
ALT: 11 U/L (ref 0–44)
AST: 18 U/L (ref 15–41)
Albumin: 4.1 g/dL (ref 3.5–5.0)
Alkaline Phosphatase: 73 U/L (ref 38–126)
Anion gap: 11 (ref 5–15)
BUN: 9 mg/dL (ref 8–23)
CO2: 24 mmol/L (ref 22–32)
Calcium: 9.4 mg/dL (ref 8.9–10.3)
Chloride: 98 mmol/L (ref 98–111)
Creatinine: 0.66 mg/dL (ref 0.44–1.00)
GFR, Estimated: >60 mL/min (ref >60)
Glucose, Bld: 99 mg/dL (ref 70–99)
Potassium: 4.1 mmol/L (ref 3.5–5.1)
Sodium: 133 mmol/L — ABNORMAL LOW (ref 135–145)
Total Bilirubin: 0.5 mg/dL (ref 0.0–1.2)
Total Protein: 6.7 g/dL (ref 6.5–8.1)

## 2024-04-24 LAB — CBC WITH DIFFERENTIAL (CANCER CENTER ONLY)
Abs Immature Granulocytes: 0.01 K/uL (ref 0.00–0.07)
Basophils Absolute: 0.1 K/uL (ref 0.0–0.1)
Basophils Relative: 1 %
Eosinophils Absolute: 0.1 K/uL (ref 0.0–0.5)
Eosinophils Relative: 1 %
HCT: 36.2 % (ref 36.0–46.0)
Hemoglobin: 12.3 g/dL (ref 12.0–15.0)
Immature Granulocytes: 0 %
Lymphocytes Relative: 35 %
Lymphs Abs: 2 K/uL (ref 0.7–4.0)
MCH: 28.3 pg (ref 26.0–34.0)
MCHC: 34 g/dL (ref 30.0–36.0)
MCV: 83.4 fL (ref 80.0–100.0)
Monocytes Absolute: 0.7 K/uL (ref 0.1–1.0)
Monocytes Relative: 11 %
Neutro Abs: 3 K/uL (ref 1.7–7.7)
Neutrophils Relative %: 52 %
Platelet Count: 209 K/uL (ref 150–400)
RBC: 4.34 MIL/uL (ref 3.87–5.11)
RDW: 13.9 % (ref 11.5–15.5)
WBC Count: 5.8 K/uL (ref 4.0–10.5)
nRBC: 0 % (ref 0.0–0.2)

## 2024-04-24 LAB — MAGNESIUM: Magnesium: 1.9 mg/dL (ref 1.7–2.4)

## 2024-04-24 MED ORDER — METHYLPREDNISOLONE 4 MG PO TBPK: ORAL_TABLET | ORAL | 0 refills | Status: DC

## 2024-04-24 MED ORDER — SODIUM CHLORIDE 0.9 % IV SOLN: INTRAVENOUS | Status: DC

## 2024-04-24 MED ORDER — DIPHENHYDRAMINE HCL 25 MG PO CAPS: 50.0000 mg | ORAL_CAPSULE | Freq: Once | ORAL | Status: DC

## 2024-04-24 MED ORDER — FAMOTIDINE IN NACL 20-0.9 MG/50ML-% IV SOLN
20.0000 mg | Freq: Once | INTRAVENOUS | Status: AC
  Administered 2024-04-24: 20 mg via INTRAVENOUS
  Filled 2024-04-24: qty 50

## 2024-04-24 MED ORDER — PERTUZ-TRASTUZ-HYALURON-ZZXF 60-60-2000 MG-MG-U/ML CHEMO ~~LOC~~ SOLN
10.0000 mL | Freq: Once | SUBCUTANEOUS | Status: AC
  Administered 2024-04-24: 600 mg via SUBCUTANEOUS
  Filled 2024-04-24: qty 10

## 2024-04-24 MED ORDER — ACETAMINOPHEN 325 MG PO TABS: 650.0000 mg | ORAL_TABLET | Freq: Once | ORAL | Status: DC

## 2024-04-24 NOTE — Patient Instructions (Signed)
Pertuzumab; Trastuzumab; Hyaluronidase Injection What is this medication? PERTUZUMAB; TRASTUZUMAB; HYALURONIDASE (per TOOZ ue mab; tras TOO zoo mab; hye al ur ON i dase) treats breast cancer. Pertuzumab and trastuzumab work by blocking a protein that causes cancer cells to grow and multiply. This helps to slow or stop the spread of cancer cells. Hyaluronidase works by increasing the absorption of other medications in the body to help them work better. It is a combination medication that contains two monoclonal antibodies. This medicine may be used for other purposes; ask your health care provider or pharmacist if you have questions. COMMON BRAND NAME(S): PHESGO What should I tell my care team before I take this medication? They need to know if you have any of these conditions: Heart failure High blood pressure Irregular heartbeat or rhythm Lung disease An unusual or allergic reaction to pertuzumab, trastuzumab, hyaluronidase, other medications, foods, dyes, or preservatives Pregnant or trying to get pregnant Breast-feeding How should I use this medication? This medication is injected under the skin. It is usually given by your care team in a hospital or clinic setting. It may also be given at home by your care team. Talk to your care team about the use of this medication in children. Special care may be needed. Overdosage: If you think you have taken too much of this medicine contact a poison control center or emergency room at once. NOTE: This medicine is only for you. Do not share this medicine with others. What if I miss a dose? Keep appointments for follow-up doses. It is important not to miss your dose. Call your care team if you are unable to keep an appointment. What may interact with this medication? Certain types of chemotherapy, such as daunorubicin, doxorubicin, epirubicin, idarubicin This list may not describe all possible interactions. Give your health care provider a list of all  the medicines, herbs, non-prescription drugs, or dietary supplements you use. Also tell them if you smoke, drink alcohol, or use illegal drugs. Some items may interact with your medicine. What should I watch for while using this medication? Your condition will be monitored carefully while you are receiving this medication. This medication may make you feel generally unwell. This is not uncommon as chemotherapy can affect healthy cells as well as cancer cells. Report any side effects. Continue your course of treatment even though you feel ill unless your care team tells you to stop. This medication may increase your risk of getting an infection. Call your care team for advice if you get a fever, chills, sore throat, or other symptoms of a cold or flu. Do not treat yourself. Try to avoid being around people who are sick. Avoid taking medications that contain aspirin, acetaminophen, ibuprofen, naproxen, or ketoprofen unless instructed by your care team. These medications may hide a fever. Talk to your care team if you may be pregnant. Serious birth defects can occur if you take this medication during pregnancy and for 7 months after the last dose. You will need a negative pregnancy test before starting this medication. Contraception is recommended while taking this medication and for 7 months after the last dose. Your care team can help you find the option that works for you. Do not breastfeed while taking this medication and for 7 months after the last dose. What side effects may I notice from receiving this medication? Side effects that you should report to your care team as soon as possible: Allergic reactions or angioedema--skin rash, itching or hives, swelling of the  face, eyes, lips, tongue, arms, or legs, trouble swallowing or breathing Dry cough, shortness of breath or trouble breathing Heart failure--shortness of breath, swelling of the ankles, feet, or hands, sudden weight gain, unusual weakness  or fatigue Infection--fever, chills, cough, or sore throat Infusion reactions--chest pain, shortness of breath or trouble breathing, feeling faint or lightheaded Side effects that usually do not require medical attention (report these to your care team if they continue or are bothersome): Diarrhea Hair loss Nausea Pain, redness, or irritation at injection site Pain, redness, or swelling with sores inside the mouth or throat Unusual weakness or fatigue This list may not describe all possible side effects. Call your doctor for medical advice about side effects. You may report side effects to FDA at 1-800-FDA-1088. Where should I keep my medication? This medication is given in a hospital or clinic. It will not be stored at home. NOTE: This sheet is a summary. It may not cover all possible information. If you have questions about this medicine, talk to your doctor, pharmacist, or health care provider.  2024 Elsevier/Gold Standard (2021-11-25 00:00:00)

## 2024-04-24 NOTE — Progress Notes (Signed)
 Declines flu vaccine. [name] in to assess rash. Orders received.

## 2024-04-24 NOTE — Telephone Encounter (Signed)
 Pt reports that she has developed itchy rash on her back , lower left and right sides, and few spots on her arms. She has been on the anastrozole  for about 2 weeks now, and feels this is the culprit. I told her when she comes in today for infusion, the nurse will evaluate and notify the provider. Message sent to Eleanor Harl PIETY, and Lisa Guy,RN, infusion nurse assigned to pt today.

## 2024-04-29 ENCOUNTER — Telehealth: Payer: Self-pay

## 2024-04-29 NOTE — Telephone Encounter (Signed)
 Pt called to let us  know that the rash is starting to dry up. No new spots.I didn't get the prescription until yesterday. I haven't started it because I'm having surgery on my gums this morning. I see my primary care doctor tomorrow and will have them look at it. She states she still has some itching in some places.

## 2024-05-04 ENCOUNTER — Other Ambulatory Visit: Payer: Self-pay | Admitting: Hematology and Oncology

## 2024-05-04 DIAGNOSIS — Z170 Estrogen receptor positive status [ER+]: Secondary | ICD-10-CM

## 2024-05-04 DIAGNOSIS — C50411 Malignant neoplasm of upper-outer quadrant of right female breast: Secondary | ICD-10-CM

## 2024-05-08 ENCOUNTER — Encounter: Payer: Self-pay | Admitting: Oncology

## 2024-05-14 NOTE — Progress Notes (Unsigned)
 [facility] [address] [place],  [place]  [address] [phone]  Clinic Day:  05/15/2024  Referring physician: Pia Kerney SQUIBB, MD   HISTORY OF PRESENT ILLNESS:  Brittney PINKSTAFF is a 83 y.o. 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   As she did not want chemotherapy or a port placed, she has been receiving SQ Phesgo  (trastuzumab/pertuzumab ) every 3 weeks in conjunction with letrozole  for her adjuvant breast cancer management.  She comes in today to be evaluated before heading into her 6th cycle of Phesgo .  Thus far, she has tolerated her Phesgo  shots fairly well.  However, she claims both letrozole  and anastrozole  pills both caused her rashes, which led to her discontinuing her endocrine therapy.  She denies having any changes in her breasts which concern her for early disease recurrence.  PHYSICAL EXAM:  Blood pressure (!) 152/89, pulse 79, temperature 97.6 F (36.4 C), temperature source Oral, resp. rate 14, height 5' 4 (1.626 m), weight 131 lb 4.8 oz (59.6 kg), SpO2 95%. Wt Readings from Last 3 Encounters:  05/15/24 131 lb 4.8 oz (59.6 kg)  03/13/24 132 lb 12.8 oz (60.2 kg)  02/21/24 138 lb (62.6 kg)   Body mass index is 22.54 kg/m. Performance status (ECOG): 1 - Symptomatic but completely ambulatory Physical Exam Constitutional:      Appearance: Normal appearance.  HENT:     Mouth/Throat:     Pharynx: Oropharynx is clear. No oropharyngeal exudate.  Cardiovascular:     Rate and Rhythm: Normal rate and regular rhythm.     Heart sounds: No murmur heard.    No friction rub. No gallop.  Pulmonary:     Breath sounds: Normal breath sounds.  Chest:  Breasts:    Right: No swelling, bleeding, inverted nipple, mass, nipple discharge or skin change.     Left: No swelling, bleeding, inverted nipple, mass, nipple discharge or skin change.     Comments: Her right breast  is healing well from both her lumpectomy and sentinel lymph node biopsy. Abdominal:     General: Bowel sounds are normal. There is no distension.     Palpations: Abdomen is soft. There is no mass.     Tenderness: There is no abdominal tenderness.  Musculoskeletal:        General: No tenderness.     Cervical back: Normal range of motion and neck supple.     Right lower leg: No edema.     Left lower leg: No edema.  Lymphadenopathy:     Cervical: No cervical adenopathy.     Right cervical: No superficial, deep or posterior cervical adenopathy.    Left cervical: No superficial, deep or posterior cervical adenopathy.     Upper Body:     Right upper body: No supraclavicular or axillary adenopathy.     Left upper body: No supraclavicular or axillary adenopathy.     Lower Body: No right inguinal adenopathy. No left inguinal adenopathy.  Skin:    Coloration: Skin is not jaundiced.     Findings: No lesion or rash.  Neurological:     General: No focal deficit present.     Mental Status: She is alert and oriented to person, place, and time. Mental status is at baseline.  Psychiatric:        Mood and Affect: Mood normal.        Behavior: Behavior normal.  Thought Content: Thought content normal.        Judgment: Judgment normal.    LABS:      Latest Ref Rng & Units 05/15/2024   10:23 AM 04/24/2024    1:18 PM 04/03/2024    1:30 PM  CBC  WBC 4.0 - 10.5 K/uL 6.0  5.8  5.0   Hemoglobin 12.0 - 15.0 g/dL 88.4  87.6  87.5   Hematocrit 36.0 - 46.0 % 34.1  36.2  36.9   Platelets 150 - 400 K/uL 208  209  183       Latest Ref Rng & Units 05/15/2024   10:23 AM 04/24/2024    1:18 PM 04/03/2024    1:30 PM  CMP  Glucose 70 - 99 mg/dL 846  99  889   BUN 8 - 23 mg/dL 7  9  14    Creatinine 0.44 - 1.00 mg/dL 9.39  9.33  9.39   Sodium 135 - 145 mmol/L 135  133  136   Potassium 3.5 - 5.1 mmol/L 3.9  4.1  3.6   Chloride 98 - 111 mmol/L 101  98  100   CO2 22 - 32 mmol/L 24  24  24    Calcium  8.9 - 10.3 mg/dL 9.2  9.4  9.3   Total Protein 6.5 - 8.1 g/dL 6.2  6.7  6.6   Total Bilirubin 0.0 - 1.2 mg/dL 0.3  0.5  0.4   Alkaline Phos 38 - 126 U/L 70  73  67   AST 15 - 41 U/L 18  18  19    ALT 0 - 44 U/L 7  11  7      STUDIES:  Her breast cancer pathology revealed the following:  FINAL MICROSCOPIC DIAGNOSIS:  A. LYMPH NODE, RIGHT AXILLARY #1, SENTINEL, EXCISION: - One lymph node negative for malignancy (0/1). - See cancer summary below.  B. LYMPH NODE, RIGHT AXILLARY #2, SENTINEL, EXCISION: - One lymph node negative for malignancy (0/1). - See cancer summary below. - A pancytokeratin stain was examined due areas of marked thermal artifact.  C. 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 - See cancer summary below. - Biopsy site with coil clip. - Unremarkable skin.  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 Histologic Grade (Nottingham Histologic Score)      Glandular (Acinar)/Tubular Differentiation: 3      Nuclear Pleomorphism: 3      Mitotic Rate: 3      Overall Grade: 3 Tumor Size: Greatest dimension of largest invasive focus: 35 mm Ductal Carcinoma In Situ (DCIS): Not identified Tumor Extent: Not applicable Lymphatic and/or Vascular Invasion: Not identified Treatment Effect in the Breast: No known presurgical therapy  MARGINS Margin Status for Invasive Carcinoma: All margins negative for invasive carcinoma      Distance from closest margin: 11 mm      Specify closest margin: Posterior  Margin Status for DCIS / PLCIS: All margins negative for DCIS / PLCIS      Distance from DCIS to closest margin: 2 mm      Specify closest margin:  Superior  REGIONAL LYMPH NODES Regional Lymph Node Status: All regional lymph nodes negative for tumor      Total Number of Lymph Nodes Examined (sentinel and non-sentinel):  2       Number of Sentinel Nodes Examined: 2  DISTANT METASTASIS Distant Site(s) Involved, if applicable: Not applicable  PATHOLOGIC STAGE CLASSIFICATION (pTNM, AJCC 8th Edition): Modified Classification: Not applicable pT Category: pT2      T Suffix: Not applicable pN Category: pN0      N Suffix: (sn) pM Category: Not applicable  ADDENDUM:  PROGNOSTIC INDICATOR RESULTS (REPEAT):   Immunohistochemical and morphometric analysis performed manually.   The tumor cells are POSITIVE for Her2 (3+).   Estrogen Receptor:       POSITIVE, 35%, MOERATE TO STRONG STAINING  Progesterone Receptor:   NEGATIVE (0)  Proliferation Marker Ki-67:   20%    ASSESSMENT & PLAN:   [name] is an 83 y.o. 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  She will proceed with her 6th cycle of Phesgo  today, which is a subcutaneous injection that contains both Herceptin and Perjeta . Both agents are used to offset the activity/proliferation of HER2 positive breast cancer.  This injection will continue to be given once every 3 weeks to complete 1 full year of anti-HER2 therapy.  As mentioned previously, the patient has had problems with to aromatase inhibitors.  However, she is willing to try another 1 to see if it causes less of a rash.  She will now be prescribed exemestane 25 mg daily.  She knows the goal is still for her to take endocrine therapy daily for 5 years.  Overall, the patient appears to be doing well.  I will see her back in 12 weeks before she heads into her 10th cycle of Phesgo .  The patient understands all the plans discussed today and is in agreement with them.  Addisynn Vassell DELENA Kerns, MD

## 2024-05-15 ENCOUNTER — Other Ambulatory Visit: Payer: Self-pay | Admitting: Oncology

## 2024-05-15 ENCOUNTER — Inpatient Hospital Stay

## 2024-05-15 ENCOUNTER — Inpatient Hospital Stay (HOSPITAL_BASED_OUTPATIENT_CLINIC_OR_DEPARTMENT_OTHER): Admitting: Oncology

## 2024-05-15 VITALS — BP 158/86 | HR 72 | Temp 97.5°F | Resp 18

## 2024-05-15 VITALS — BP 152/89 | HR 79 | Temp 97.6°F | Resp 14 | Ht 64.0 in | Wt 131.3 lb

## 2024-05-15 DIAGNOSIS — Z170 Estrogen receptor positive status [ER+]: Secondary | ICD-10-CM | POA: Diagnosis not present

## 2024-05-15 DIAGNOSIS — C50411 Malignant neoplasm of upper-outer quadrant of right female breast: Secondary | ICD-10-CM | POA: Diagnosis not present

## 2024-05-15 LAB — CBC WITH DIFFERENTIAL (CANCER CENTER ONLY)
Abs Immature Granulocytes: 0.01 K/uL (ref 0.00–0.07)
Basophils Absolute: 0.1 K/uL (ref 0.0–0.1)
Basophils Relative: 1 %
Eosinophils Absolute: 0.1 K/uL (ref 0.0–0.5)
Eosinophils Relative: 1 %
HCT: 34.1 % — ABNORMAL LOW (ref 36.0–46.0)
Hemoglobin: 11.5 g/dL — ABNORMAL LOW (ref 12.0–15.0)
Immature Granulocytes: 0 %
Lymphocytes Relative: 23 %
Lymphs Abs: 1.4 K/uL (ref 0.7–4.0)
MCH: 28.3 pg (ref 26.0–34.0)
MCHC: 33.7 g/dL (ref 30.0–36.0)
MCV: 84 fL (ref 80.0–100.0)
Monocytes Absolute: 0.5 K/uL (ref 0.1–1.0)
Monocytes Relative: 8 %
Neutro Abs: 4 K/uL (ref 1.7–7.7)
Neutrophils Relative %: 67 %
Platelet Count: 208 K/uL (ref 150–400)
RBC: 4.06 MIL/uL (ref 3.87–5.11)
RDW: 14.4 % (ref 11.5–15.5)
WBC Count: 6 K/uL (ref 4.0–10.5)
nRBC: 0 % (ref 0.0–0.2)

## 2024-05-15 LAB — CMP (CANCER CENTER ONLY)
ALT: 7 U/L (ref 0–44)
AST: 18 U/L (ref 15–41)
Albumin: 4 g/dL (ref 3.5–5.0)
Alkaline Phosphatase: 70 U/L (ref 38–126)
Anion gap: 11 (ref 5–15)
BUN: 7 mg/dL — ABNORMAL LOW (ref 8–23)
CO2: 24 mmol/L (ref 22–32)
Calcium: 9.2 mg/dL (ref 8.9–10.3)
Chloride: 101 mmol/L (ref 98–111)
Creatinine: 0.6 mg/dL (ref 0.44–1.00)
GFR, Estimated: >60 mL/min (ref >60)
Glucose, Bld: 153 mg/dL — ABNORMAL HIGH (ref 70–99)
Potassium: 3.9 mmol/L (ref 3.5–5.1)
Sodium: 135 mmol/L (ref 135–145)
Total Bilirubin: 0.3 mg/dL (ref 0.0–1.2)
Total Protein: 6.2 g/dL — ABNORMAL LOW (ref 6.5–8.1)

## 2024-05-15 LAB — MAGNESIUM: Magnesium: 1.7 mg/dL (ref 1.7–2.4)

## 2024-05-15 MED ORDER — PERTUZ-TRASTUZ-HYALURON-ZZXF 60-60-2000 MG-MG-U/ML CHEMO ~~LOC~~ SOLN
10.0000 mL | Freq: Once | SUBCUTANEOUS | Status: AC
  Administered 2024-05-15: 600 mg via SUBCUTANEOUS
  Filled 2024-05-15: qty 10

## 2024-05-15 MED ORDER — EXEMESTANE 25 MG PO TABS: 25.0000 mg | ORAL_TABLET | Freq: Every day | ORAL | 3 refills | Status: AC

## 2024-05-15 MED ORDER — DIPHENHYDRAMINE HCL 25 MG PO CAPS
50.0000 mg | ORAL_CAPSULE | Freq: Once | ORAL | Status: DC
  Filled 2024-05-15: qty 2

## 2024-05-15 MED ORDER — ACETAMINOPHEN 325 MG PO TABS
650.0000 mg | ORAL_TABLET | Freq: Once | ORAL | Status: DC
  Filled 2024-05-15: qty 2

## 2024-05-15 NOTE — Patient Instructions (Signed)
Pertuzumab; Trastuzumab; Hyaluronidase Injection What is this medication? PERTUZUMAB; TRASTUZUMAB; HYALURONIDASE (per TOOZ ue mab; tras TOO zoo mab; hye al ur ON i dase) treats breast cancer. Pertuzumab and trastuzumab work by blocking a protein that causes cancer cells to grow and multiply. This helps to slow or stop the spread of cancer cells. Hyaluronidase works by increasing the absorption of other medications in the body to help them work better. It is a combination medication that contains two monoclonal antibodies. This medicine may be used for other purposes; ask your health care provider or pharmacist if you have questions. COMMON BRAND NAME(S): PHESGO What should I tell my care team before I take this medication? They need to know if you have any of these conditions: Heart failure High blood pressure Irregular heartbeat or rhythm Lung disease An unusual or allergic reaction to pertuzumab, trastuzumab, hyaluronidase, other medications, foods, dyes, or preservatives Pregnant or trying to get pregnant Breast-feeding How should I use this medication? This medication is injected under the skin. It is usually given by your care team in a hospital or clinic setting. It may also be given at home by your care team. Talk to your care team about the use of this medication in children. Special care may be needed. Overdosage: If you think you have taken too much of this medicine contact a poison control center or emergency room at once. NOTE: This medicine is only for you. Do not share this medicine with others. What if I miss a dose? Keep appointments for follow-up doses. It is important not to miss your dose. Call your care team if you are unable to keep an appointment. What may interact with this medication? Certain types of chemotherapy, such as daunorubicin, doxorubicin, epirubicin, idarubicin This list may not describe all possible interactions. Give your health care provider a list of all  the medicines, herbs, non-prescription drugs, or dietary supplements you use. Also tell them if you smoke, drink alcohol, or use illegal drugs. Some items may interact with your medicine. What should I watch for while using this medication? Your condition will be monitored carefully while you are receiving this medication. This medication may make you feel generally unwell. This is not uncommon as chemotherapy can affect healthy cells as well as cancer cells. Report any side effects. Continue your course of treatment even though you feel ill unless your care team tells you to stop. This medication may increase your risk of getting an infection. Call your care team for advice if you get a fever, chills, sore throat, or other symptoms of a cold or flu. Do not treat yourself. Try to avoid being around people who are sick. Avoid taking medications that contain aspirin, acetaminophen, ibuprofen, naproxen, or ketoprofen unless instructed by your care team. These medications may hide a fever. Talk to your care team if you may be pregnant. Serious birth defects can occur if you take this medication during pregnancy and for 7 months after the last dose. You will need a negative pregnancy test before starting this medication. Contraception is recommended while taking this medication and for 7 months after the last dose. Your care team can help you find the option that works for you. Do not breastfeed while taking this medication and for 7 months after the last dose. What side effects may I notice from receiving this medication? Side effects that you should report to your care team as soon as possible: Allergic reactions or angioedema--skin rash, itching or hives, swelling of the  face, eyes, lips, tongue, arms, or legs, trouble swallowing or breathing Dry cough, shortness of breath or trouble breathing Heart failure--shortness of breath, swelling of the ankles, feet, or hands, sudden weight gain, unusual weakness  or fatigue Infection--fever, chills, cough, or sore throat Infusion reactions--chest pain, shortness of breath or trouble breathing, feeling faint or lightheaded Side effects that usually do not require medical attention (report these to your care team if they continue or are bothersome): Diarrhea Hair loss Nausea Pain, redness, or irritation at injection site Pain, redness, or swelling with sores inside the mouth or throat Unusual weakness or fatigue This list may not describe all possible side effects. Call your doctor for medical advice about side effects. You may report side effects to FDA at 1-800-FDA-1088. Where should I keep my medication? This medication is given in a hospital or clinic. It will not be stored at home. NOTE: This sheet is a summary. It may not cover all possible information. If you have questions about this medicine, talk to your doctor, pharmacist, or health care provider.  2024 Elsevier/Gold Standard (2021-11-25 00:00:00)

## 2024-05-15 NOTE — Progress Notes (Signed)
 Face to face to face contact with pt in [facility]. Pt is here for follow up with Dr. Ezzard . Pt reports that she has tried two aromatase inhibitors and has not been able to tolerate them. She will talk with Dr. Ezzard today about what to do going forward. She feels that she is tolerating her other treatment at this point.

## 2024-05-16 ENCOUNTER — Other Ambulatory Visit: Payer: Self-pay

## 2024-05-23 ENCOUNTER — Telehealth: Payer: Self-pay

## 2024-05-23 NOTE — Telephone Encounter (Signed)
 [name], Representative from [employer] LVM on nurse line stating that the exemestane has been approved through [place] process. Exemestane copay will be as Tier 2, rather than Tier 4. Authorization good for 1 year, starting 05/22/2024. Message forwarded to [facility], as FYI.

## 2024-06-05 ENCOUNTER — Inpatient Hospital Stay: Attending: Hematology

## 2024-06-05 ENCOUNTER — Inpatient Hospital Stay

## 2024-06-05 ENCOUNTER — Encounter: Payer: Self-pay | Admitting: Oncology

## 2024-06-05 VITALS — BP 156/87 | HR 81 | Temp 98.1°F | Resp 18 | Ht 64.0 in | Wt 127.0 lb

## 2024-06-05 DIAGNOSIS — Z1731 Human epidermal growth factor receptor 2 positive status: Secondary | ICD-10-CM | POA: Diagnosis not present

## 2024-06-05 DIAGNOSIS — C50411 Malignant neoplasm of upper-outer quadrant of right female breast: Secondary | ICD-10-CM | POA: Insufficient documentation

## 2024-06-05 DIAGNOSIS — Z5111 Encounter for antineoplastic chemotherapy: Secondary | ICD-10-CM | POA: Insufficient documentation

## 2024-06-05 DIAGNOSIS — Z170 Estrogen receptor positive status [ER+]: Secondary | ICD-10-CM | POA: Diagnosis not present

## 2024-06-05 LAB — CBC WITH DIFFERENTIAL (CANCER CENTER ONLY)
Abs Immature Granulocytes: 0.02 K/uL (ref 0.00–0.07)
Basophils Absolute: 0.1 K/uL (ref 0.0–0.1)
Basophils Relative: 1 %
Eosinophils Absolute: 0.1 K/uL (ref 0.0–0.5)
Eosinophils Relative: 2 %
HCT: 34.8 % — ABNORMAL LOW (ref 36.0–46.0)
Hemoglobin: 12.1 g/dL (ref 12.0–15.0)
Immature Granulocytes: 0 %
Lymphocytes Relative: 26 %
Lymphs Abs: 1.7 K/uL (ref 0.7–4.0)
MCH: 28.9 pg (ref 26.0–34.0)
MCHC: 34.8 g/dL (ref 30.0–36.0)
MCV: 83.3 fL (ref 80.0–100.0)
Monocytes Absolute: 0.7 K/uL (ref 0.1–1.0)
Monocytes Relative: 10 %
Neutro Abs: 4.1 K/uL (ref 1.7–7.7)
Neutrophils Relative %: 61 %
Platelet Count: 218 K/uL (ref 150–400)
RBC: 4.18 MIL/uL (ref 3.87–5.11)
RDW: 14.4 % (ref 11.5–15.5)
WBC Count: 6.7 K/uL (ref 4.0–10.5)
nRBC: 0 % (ref 0.0–0.2)

## 2024-06-05 LAB — CMP (CANCER CENTER ONLY)
ALT: 11 U/L (ref 0–44)
AST: 19 U/L (ref 15–41)
Albumin: 4 g/dL (ref 3.5–5.0)
Alkaline Phosphatase: 66 U/L (ref 38–126)
Anion gap: 10 (ref 5–15)
BUN: 12 mg/dL (ref 8–23)
CO2: 26 mmol/L (ref 22–32)
Calcium: 9.4 mg/dL (ref 8.9–10.3)
Chloride: 101 mmol/L (ref 98–111)
Creatinine: 0.63 mg/dL (ref 0.44–1.00)
GFR, Estimated: >60 mL/min (ref >60)
Glucose, Bld: 108 mg/dL — ABNORMAL HIGH (ref 70–99)
Potassium: 3.7 mmol/L (ref 3.5–5.1)
Sodium: 137 mmol/L (ref 135–145)
Total Bilirubin: 0.3 mg/dL (ref 0.0–1.2)
Total Protein: 6.6 g/dL (ref 6.5–8.1)

## 2024-06-05 LAB — MAGNESIUM: Magnesium: 1.5 mg/dL — ABNORMAL LOW (ref 1.7–2.4)

## 2024-06-05 MED ORDER — ACETAMINOPHEN 325 MG PO TABS: 650.0000 mg | ORAL_TABLET | Freq: Once | ORAL | Status: DC

## 2024-06-05 MED ORDER — PERTUZ-TRASTUZ-HYALURON-ZZXF 60-60-2000 MG-MG-U/ML CHEMO ~~LOC~~ SOLN
10.0000 mL | Freq: Once | SUBCUTANEOUS | Status: AC
  Administered 2024-06-05: 600 mg via SUBCUTANEOUS
  Filled 2024-06-05: qty 10

## 2024-06-05 MED ORDER — DIPHENHYDRAMINE HCL 25 MG PO CAPS: 50.0000 mg | ORAL_CAPSULE | Freq: Once | ORAL | Status: DC

## 2024-06-05 NOTE — Progress Notes (Signed)
 Pt to take PO mag for 7 days for mag 1.5

## 2024-06-05 NOTE — Patient Instructions (Signed)
Pertuzumab; Trastuzumab; Hyaluronidase Injection What is this medication? PERTUZUMAB; TRASTUZUMAB; HYALURONIDASE (per TOOZ ue mab; tras TOO zoo mab; hye al ur ON i dase) treats breast cancer. Pertuzumab and trastuzumab work by blocking a protein that causes cancer cells to grow and multiply. This helps to slow or stop the spread of cancer cells. Hyaluronidase works by increasing the absorption of other medications in the body to help them work better. It is a combination medication that contains two monoclonal antibodies. This medicine may be used for other purposes; ask your health care provider or pharmacist if you have questions. COMMON BRAND NAME(S): PHESGO What should I tell my care team before I take this medication? They need to know if you have any of these conditions: Heart failure High blood pressure Irregular heartbeat or rhythm Lung disease An unusual or allergic reaction to pertuzumab, trastuzumab, hyaluronidase, other medications, foods, dyes, or preservatives Pregnant or trying to get pregnant Breast-feeding How should I use this medication? This medication is injected under the skin. It is usually given by your care team in a hospital or clinic setting. It may also be given at home by your care team. Talk to your care team about the use of this medication in children. Special care may be needed. Overdosage: If you think you have taken too much of this medicine contact a poison control center or emergency room at once. NOTE: This medicine is only for you. Do not share this medicine with others. What if I miss a dose? Keep appointments for follow-up doses. It is important not to miss your dose. Call your care team if you are unable to keep an appointment. What may interact with this medication? Certain types of chemotherapy, such as daunorubicin, doxorubicin, epirubicin, idarubicin This list may not describe all possible interactions. Give your health care provider a list of all  the medicines, herbs, non-prescription drugs, or dietary supplements you use. Also tell them if you smoke, drink alcohol, or use illegal drugs. Some items may interact with your medicine. What should I watch for while using this medication? Your condition will be monitored carefully while you are receiving this medication. This medication may make you feel generally unwell. This is not uncommon as chemotherapy can affect healthy cells as well as cancer cells. Report any side effects. Continue your course of treatment even though you feel ill unless your care team tells you to stop. This medication may increase your risk of getting an infection. Call your care team for advice if you get a fever, chills, sore throat, or other symptoms of a cold or flu. Do not treat yourself. Try to avoid being around people who are sick. Avoid taking medications that contain aspirin, acetaminophen, ibuprofen, naproxen, or ketoprofen unless instructed by your care team. These medications may hide a fever. Talk to your care team if you may be pregnant. Serious birth defects can occur if you take this medication during pregnancy and for 7 months after the last dose. You will need a negative pregnancy test before starting this medication. Contraception is recommended while taking this medication and for 7 months after the last dose. Your care team can help you find the option that works for you. Do not breastfeed while taking this medication and for 7 months after the last dose. What side effects may I notice from receiving this medication? Side effects that you should report to your care team as soon as possible: Allergic reactions or angioedema--skin rash, itching or hives, swelling of the  face, eyes, lips, tongue, arms, or legs, trouble swallowing or breathing Dry cough, shortness of breath or trouble breathing Heart failure--shortness of breath, swelling of the ankles, feet, or hands, sudden weight gain, unusual weakness  or fatigue Infection--fever, chills, cough, or sore throat Infusion reactions--chest pain, shortness of breath or trouble breathing, feeling faint or lightheaded Side effects that usually do not require medical attention (report these to your care team if they continue or are bothersome): Diarrhea Hair loss Nausea Pain, redness, or irritation at injection site Pain, redness, or swelling with sores inside the mouth or throat Unusual weakness or fatigue This list may not describe all possible side effects. Call your doctor for medical advice about side effects. You may report side effects to FDA at 1-800-FDA-1088. Where should I keep my medication? This medication is given in a hospital or clinic. It will not be stored at home. NOTE: This sheet is a summary. It may not cover all possible information. If you have questions about this medicine, talk to your doctor, pharmacist, or health care provider.  2024 Elsevier/Gold Standard (2021-11-25 00:00:00)

## 2024-06-06 ENCOUNTER — Ambulatory Visit: Attending: Oncology

## 2024-06-06 DIAGNOSIS — Z0189 Encounter for other specified special examinations: Secondary | ICD-10-CM | POA: Diagnosis not present

## 2024-06-06 DIAGNOSIS — C50411 Malignant neoplasm of upper-outer quadrant of right female breast: Secondary | ICD-10-CM | POA: Diagnosis not present

## 2024-06-06 DIAGNOSIS — Z170 Estrogen receptor positive status [ER+]: Secondary | ICD-10-CM | POA: Diagnosis not present

## 2024-06-06 LAB — ECHOCARDIOGRAM COMPLETE
AR max vel: 2.05 cm2
AV Area VTI: 1.93 cm2
AV Area mean vel: 2.01 cm2
AV Mean grad: 5 mmHg
AV Peak grad: 8.1 mmHg
Ao pk vel: 1.42 m/s
Area-P 1/2: 4.98 cm2
MV M vel: 5.65 m/s
MV Peak grad: 127.7 mmHg
Radius: 0.4 cm
S' Lateral: 3.1 cm

## 2024-06-11 ENCOUNTER — Telehealth: Payer: Self-pay | Admitting: Pharmacy Technician

## 2024-06-11 NOTE — Telephone Encounter (Signed)
 PAP enrollment in progress for Phesgo 

## 2024-06-26 ENCOUNTER — Inpatient Hospital Stay: Attending: Hematology

## 2024-06-26 ENCOUNTER — Other Ambulatory Visit: Payer: Self-pay | Admitting: Oncology

## 2024-06-26 ENCOUNTER — Inpatient Hospital Stay

## 2024-06-26 VITALS — BP 169/79 | HR 94 | Temp 97.8°F | Resp 18 | Ht 64.0 in | Wt 129.0 lb

## 2024-06-26 DIAGNOSIS — Z1731 Human epidermal growth factor receptor 2 positive status: Secondary | ICD-10-CM | POA: Insufficient documentation

## 2024-06-26 DIAGNOSIS — Z1721 Progesterone receptor positive status: Secondary | ICD-10-CM | POA: Diagnosis not present

## 2024-06-26 DIAGNOSIS — C50411 Malignant neoplasm of upper-outer quadrant of right female breast: Secondary | ICD-10-CM

## 2024-06-26 DIAGNOSIS — Z5111 Encounter for antineoplastic chemotherapy: Secondary | ICD-10-CM | POA: Insufficient documentation

## 2024-06-26 DIAGNOSIS — Z170 Estrogen receptor positive status [ER+]: Secondary | ICD-10-CM | POA: Diagnosis not present

## 2024-06-26 LAB — CBC WITH DIFFERENTIAL (CANCER CENTER ONLY)
Abs Immature Granulocytes: 0.01 K/uL (ref 0.00–0.07)
Basophils Absolute: 0.1 K/uL (ref 0.0–0.1)
Basophils Relative: 1 %
Eosinophils Absolute: 0.1 K/uL (ref 0.0–0.5)
Eosinophils Relative: 2 %
HCT: 35.6 % — ABNORMAL LOW (ref 36.0–46.0)
Hemoglobin: 11.7 g/dL — ABNORMAL LOW (ref 12.0–15.0)
Immature Granulocytes: 0 %
Lymphocytes Relative: 27 %
Lymphs Abs: 1.5 K/uL (ref 0.7–4.0)
MCH: 27.9 pg (ref 26.0–34.0)
MCHC: 32.9 g/dL (ref 30.0–36.0)
MCV: 84.8 fL (ref 80.0–100.0)
Monocytes Absolute: 0.6 K/uL (ref 0.1–1.0)
Monocytes Relative: 12 %
Neutro Abs: 3.1 K/uL (ref 1.7–7.7)
Neutrophils Relative %: 58 %
Platelet Count: 217 K/uL (ref 150–400)
RBC: 4.2 MIL/uL (ref 3.87–5.11)
RDW: 14.3 % (ref 11.5–15.5)
WBC Count: 5.4 K/uL (ref 4.0–10.5)
nRBC: 0 % (ref 0.0–0.2)

## 2024-06-26 LAB — CMP (CANCER CENTER ONLY)
ALT: 8 U/L (ref 0–44)
AST: 16 U/L (ref 15–41)
Albumin: 4.1 g/dL (ref 3.5–5.0)
Alkaline Phosphatase: 76 U/L (ref 38–126)
Anion gap: 10 (ref 5–15)
BUN: 8 mg/dL (ref 8–23)
CO2: 27 mmol/L (ref 22–32)
Calcium: 9.4 mg/dL (ref 8.9–10.3)
Chloride: 99 mmol/L (ref 98–111)
Creatinine: 0.62 mg/dL (ref 0.44–1.00)
GFR, Estimated: >60 mL/min (ref >60)
Glucose, Bld: 123 mg/dL — ABNORMAL HIGH (ref 70–99)
Potassium: 3.4 mmol/L — ABNORMAL LOW (ref 3.5–5.1)
Sodium: 136 mmol/L (ref 135–145)
Total Bilirubin: 0.4 mg/dL (ref 0.0–1.2)
Total Protein: 6.5 g/dL (ref 6.5–8.1)

## 2024-06-26 LAB — MAGNESIUM: Magnesium: 1.7 mg/dL (ref 1.7–2.4)

## 2024-06-26 MED ORDER — ACETAMINOPHEN 325 MG PO TABS
650.0000 mg | ORAL_TABLET | Freq: Once | ORAL | Status: DC
  Filled 2024-06-26: qty 2

## 2024-06-26 MED ORDER — DIPHENHYDRAMINE HCL 25 MG PO CAPS
50.0000 mg | ORAL_CAPSULE | Freq: Once | ORAL | Status: DC
  Filled 2024-06-26: qty 2

## 2024-06-26 MED ORDER — PERTUZ-TRASTUZ-HYALURON-ZZXF 60-60-2000 MG-MG-U/ML CHEMO ~~LOC~~ SOLN
10.0000 mL | Freq: Once | SUBCUTANEOUS | Status: AC
  Administered 2024-06-26: 600 mg via SUBCUTANEOUS
  Filled 2024-06-26: qty 10

## 2024-06-26 NOTE — Patient Instructions (Signed)
Pertuzumab; Trastuzumab; Hyaluronidase Injection What is this medication? PERTUZUMAB; TRASTUZUMAB; HYALURONIDASE (per TOOZ ue mab; tras TOO zoo mab; hye al ur ON i dase) treats breast cancer. Pertuzumab and trastuzumab work by blocking a protein that causes cancer cells to grow and multiply. This helps to slow or stop the spread of cancer cells. Hyaluronidase works by increasing the absorption of other medications in the body to help them work better. It is a combination medication that contains two monoclonal antibodies. This medicine may be used for other purposes; ask your health care provider or pharmacist if you have questions. COMMON BRAND NAME(S): PHESGO What should I tell my care team before I take this medication? They need to know if you have any of these conditions: Heart failure High blood pressure Irregular heartbeat or rhythm Lung disease An unusual or allergic reaction to pertuzumab, trastuzumab, hyaluronidase, other medications, foods, dyes, or preservatives Pregnant or trying to get pregnant Breast-feeding How should I use this medication? This medication is injected under the skin. It is usually given by your care team in a hospital or clinic setting. It may also be given at home by your care team. Talk to your care team about the use of this medication in children. Special care may be needed. Overdosage: If you think you have taken too much of this medicine contact a poison control center or emergency room at once. NOTE: This medicine is only for you. Do not share this medicine with others. What if I miss a dose? Keep appointments for follow-up doses. It is important not to miss your dose. Call your care team if you are unable to keep an appointment. What may interact with this medication? Certain types of chemotherapy, such as daunorubicin, doxorubicin, epirubicin, idarubicin This list may not describe all possible interactions. Give your health care provider a list of all  the medicines, herbs, non-prescription drugs, or dietary supplements you use. Also tell them if you smoke, drink alcohol, or use illegal drugs. Some items may interact with your medicine. What should I watch for while using this medication? Your condition will be monitored carefully while you are receiving this medication. This medication may make you feel generally unwell. This is not uncommon as chemotherapy can affect healthy cells as well as cancer cells. Report any side effects. Continue your course of treatment even though you feel ill unless your care team tells you to stop. This medication may increase your risk of getting an infection. Call your care team for advice if you get a fever, chills, sore throat, or other symptoms of a cold or flu. Do not treat yourself. Try to avoid being around people who are sick. Avoid taking medications that contain aspirin, acetaminophen, ibuprofen, naproxen, or ketoprofen unless instructed by your care team. These medications may hide a fever. Talk to your care team if you may be pregnant. Serious birth defects can occur if you take this medication during pregnancy and for 7 months after the last dose. You will need a negative pregnancy test before starting this medication. Contraception is recommended while taking this medication and for 7 months after the last dose. Your care team can help you find the option that works for you. Do not breastfeed while taking this medication and for 7 months after the last dose. What side effects may I notice from receiving this medication? Side effects that you should report to your care team as soon as possible: Allergic reactions or angioedema--skin rash, itching or hives, swelling of the  face, eyes, lips, tongue, arms, or legs, trouble swallowing or breathing Dry cough, shortness of breath or trouble breathing Heart failure--shortness of breath, swelling of the ankles, feet, or hands, sudden weight gain, unusual weakness  or fatigue Infection--fever, chills, cough, or sore throat Infusion reactions--chest pain, shortness of breath or trouble breathing, feeling faint or lightheaded Side effects that usually do not require medical attention (report these to your care team if they continue or are bothersome): Diarrhea Hair loss Nausea Pain, redness, or irritation at injection site Pain, redness, or swelling with sores inside the mouth or throat Unusual weakness or fatigue This list may not describe all possible side effects. Call your doctor for medical advice about side effects. You may report side effects to FDA at 1-800-FDA-1088. Where should I keep my medication? This medication is given in a hospital or clinic. It will not be stored at home. NOTE: This sheet is a summary. It may not cover all possible information. If you have questions about this medicine, talk to your doctor, pharmacist, or health care provider.  2024 Elsevier/Gold Standard (2021-11-25 00:00:00)

## 2024-06-27 ENCOUNTER — Telehealth: Payer: Self-pay

## 2024-06-27 NOTE — Telephone Encounter (Signed)
-----   Message from [facility] [name] sent at 06/27/2024  3:22 PM EST ----- Regarding: ECHO Results I have called pt to set up her next appts and she wanted to see if someone could call her about her recent ECHO results as she has not heard about those results.   Thanks,   [name]

## 2024-06-27 NOTE — Telephone Encounter (Signed)
 Spoke with Dr Ezzard this test was done do to the fact patients medication requires this testing. No new or worrisome findings to report message related to patient and copy of test also mailed out to patient.

## 2024-06-29 ENCOUNTER — Other Ambulatory Visit: Payer: Self-pay

## 2024-07-12 ENCOUNTER — Other Ambulatory Visit: Payer: Self-pay

## 2024-07-21 ENCOUNTER — Inpatient Hospital Stay

## 2024-07-21 VITALS — BP 154/78 | HR 90 | Temp 98.0°F | Resp 18 | Ht 64.0 in | Wt 128.2 lb

## 2024-07-21 DIAGNOSIS — Z170 Estrogen receptor positive status [ER+]: Secondary | ICD-10-CM

## 2024-07-21 DIAGNOSIS — C50411 Malignant neoplasm of upper-outer quadrant of right female breast: Secondary | ICD-10-CM | POA: Diagnosis not present

## 2024-07-21 LAB — CMP (CANCER CENTER ONLY)
ALT: 8 U/L (ref 0–44)
AST: 17 U/L (ref 15–41)
Albumin: 4 g/dL (ref 3.5–5.0)
Alkaline Phosphatase: 69 U/L (ref 38–126)
Anion gap: 8 (ref 5–15)
BUN: 13 mg/dL (ref 8–23)
CO2: 27 mmol/L (ref 22–32)
Calcium: 9.6 mg/dL (ref 8.9–10.3)
Chloride: 99 mmol/L (ref 98–111)
Creatinine: 0.58 mg/dL (ref 0.44–1.00)
GFR, Estimated: >60 mL/min
Glucose, Bld: 103 mg/dL — ABNORMAL HIGH (ref 70–99)
Potassium: 3.9 mmol/L (ref 3.5–5.1)
Sodium: 134 mmol/L — ABNORMAL LOW (ref 135–145)
Total Bilirubin: 0.4 mg/dL (ref 0.0–1.2)
Total Protein: 6.1 g/dL — ABNORMAL LOW (ref 6.5–8.1)

## 2024-07-21 LAB — CBC WITH DIFFERENTIAL (CANCER CENTER ONLY)
Abs Immature Granulocytes: 0.02 K/uL (ref 0.00–0.07)
Basophils Absolute: 0.1 K/uL (ref 0.0–0.1)
Basophils Relative: 1 %
Eosinophils Absolute: 0.1 K/uL (ref 0.0–0.5)
Eosinophils Relative: 1 %
HCT: 34.3 % — ABNORMAL LOW (ref 36.0–46.0)
Hemoglobin: 11.4 g/dL — ABNORMAL LOW (ref 12.0–15.0)
Immature Granulocytes: 0 %
Lymphocytes Relative: 25 %
Lymphs Abs: 1.7 K/uL (ref 0.7–4.0)
MCH: 28.4 pg (ref 26.0–34.0)
MCHC: 33.2 g/dL (ref 30.0–36.0)
MCV: 85.5 fL (ref 80.0–100.0)
Monocytes Absolute: 0.6 K/uL (ref 0.1–1.0)
Monocytes Relative: 9 %
Neutro Abs: 4.4 K/uL (ref 1.7–7.7)
Neutrophils Relative %: 64 %
Platelet Count: 187 K/uL (ref 150–400)
RBC: 4.01 MIL/uL (ref 3.87–5.11)
RDW: 14.1 % (ref 11.5–15.5)
WBC Count: 6.9 K/uL (ref 4.0–10.5)
nRBC: 0 % (ref 0.0–0.2)

## 2024-07-21 LAB — MAGNESIUM: Magnesium: 1.7 mg/dL (ref 1.7–2.4)

## 2024-07-21 MED ORDER — ACETAMINOPHEN 325 MG PO TABS: 650.0000 mg | ORAL_TABLET | Freq: Once | ORAL | Status: DC

## 2024-07-21 MED ORDER — DIPHENHYDRAMINE HCL 25 MG PO CAPS: 50.0000 mg | ORAL_CAPSULE | Freq: Once | ORAL | Status: DC

## 2024-07-21 MED ORDER — PERTUZ-TRASTUZ-HYALURON-ZZXF 60-60-2000 MG-MG-U/ML CHEMO ~~LOC~~ SOLN
10.0000 mL | Freq: Once | SUBCUTANEOUS | Status: AC
  Administered 2024-07-21: 600 mg via SUBCUTANEOUS
  Filled 2024-07-21: qty 10

## 2024-07-21 NOTE — Patient Instructions (Signed)
Pertuzumab; Trastuzumab; Hyaluronidase Injection What is this medication? PERTUZUMAB; TRASTUZUMAB; HYALURONIDASE (per TOOZ ue mab; tras TOO zoo mab; hye al ur ON i dase) treats breast cancer. Pertuzumab and trastuzumab work by blocking a protein that causes cancer cells to grow and multiply. This helps to slow or stop the spread of cancer cells. Hyaluronidase works by increasing the absorption of other medications in the body to help them work better. It is a combination medication that contains two monoclonal antibodies. This medicine may be used for other purposes; ask your health care provider or pharmacist if you have questions. COMMON BRAND NAME(S): PHESGO What should I tell my care team before I take this medication? They need to know if you have any of these conditions: Heart failure High blood pressure Irregular heartbeat or rhythm Lung disease An unusual or allergic reaction to pertuzumab, trastuzumab, hyaluronidase, other medications, foods, dyes, or preservatives Pregnant or trying to get pregnant Breast-feeding How should I use this medication? This medication is injected under the skin. It is usually given by your care team in a hospital or clinic setting. It may also be given at home by your care team. Talk to your care team about the use of this medication in children. Special care may be needed. Overdosage: If you think you have taken too much of this medicine contact a poison control center or emergency room at once. NOTE: This medicine is only for you. Do not share this medicine with others. What if I miss a dose? Keep appointments for follow-up doses. It is important not to miss your dose. Call your care team if you are unable to keep an appointment. What may interact with this medication? Certain types of chemotherapy, such as daunorubicin, doxorubicin, epirubicin, idarubicin This list may not describe all possible interactions. Give your health care provider a list of all  the medicines, herbs, non-prescription drugs, or dietary supplements you use. Also tell them if you smoke, drink alcohol, or use illegal drugs. Some items may interact with your medicine. What should I watch for while using this medication? Your condition will be monitored carefully while you are receiving this medication. This medication may make you feel generally unwell. This is not uncommon as chemotherapy can affect healthy cells as well as cancer cells. Report any side effects. Continue your course of treatment even though you feel ill unless your care team tells you to stop. This medication may increase your risk of getting an infection. Call your care team for advice if you get a fever, chills, sore throat, or other symptoms of a cold or flu. Do not treat yourself. Try to avoid being around people who are sick. Avoid taking medications that contain aspirin, acetaminophen, ibuprofen, naproxen, or ketoprofen unless instructed by your care team. These medications may hide a fever. Talk to your care team if you may be pregnant. Serious birth defects can occur if you take this medication during pregnancy and for 7 months after the last dose. You will need a negative pregnancy test before starting this medication. Contraception is recommended while taking this medication and for 7 months after the last dose. Your care team can help you find the option that works for you. Do not breastfeed while taking this medication and for 7 months after the last dose. What side effects may I notice from receiving this medication? Side effects that you should report to your care team as soon as possible: Allergic reactions or angioedema--skin rash, itching or hives, swelling of the  face, eyes, lips, tongue, arms, or legs, trouble swallowing or breathing Dry cough, shortness of breath or trouble breathing Heart failure--shortness of breath, swelling of the ankles, feet, or hands, sudden weight gain, unusual weakness  or fatigue Infection--fever, chills, cough, or sore throat Infusion reactions--chest pain, shortness of breath or trouble breathing, feeling faint or lightheaded Side effects that usually do not require medical attention (report these to your care team if they continue or are bothersome): Diarrhea Hair loss Nausea Pain, redness, or irritation at injection site Pain, redness, or swelling with sores inside the mouth or throat Unusual weakness or fatigue This list may not describe all possible side effects. Call your doctor for medical advice about side effects. You may report side effects to FDA at 1-800-FDA-1088. Where should I keep my medication? This medication is given in a hospital or clinic. It will not be stored at home. NOTE: This sheet is a summary. It may not cover all possible information. If you have questions about this medicine, talk to your doctor, pharmacist, or health care provider.  2024 Elsevier/Gold Standard (2021-11-25 00:00:00)

## 2024-07-22 ENCOUNTER — Other Ambulatory Visit: Payer: Self-pay

## 2024-07-23 ENCOUNTER — Other Ambulatory Visit: Payer: Self-pay

## 2024-07-30 ENCOUNTER — Telehealth: Payer: Self-pay | Admitting: Pharmacy Technician

## 2024-07-30 NOTE — Telephone Encounter (Signed)
 Patient is receiving foundation assistance  Medication: Phesgo  Program: [employer] Approval Dates: Approved from conditionally 07/30/24 until 08/29/24 ID: [id number]

## 2024-08-07 ENCOUNTER — Inpatient Hospital Stay

## 2024-08-07 ENCOUNTER — Other Ambulatory Visit: Payer: Self-pay

## 2024-08-07 ENCOUNTER — Inpatient Hospital Stay: Admitting: Oncology

## 2024-08-08 ENCOUNTER — Encounter: Payer: Self-pay | Admitting: Oncology

## 2024-08-11 ENCOUNTER — Encounter: Payer: Self-pay | Admitting: Oncology

## 2024-08-11 ENCOUNTER — Inpatient Hospital Stay: Attending: Hematology

## 2024-08-11 ENCOUNTER — Other Ambulatory Visit: Payer: Self-pay

## 2024-08-11 ENCOUNTER — Inpatient Hospital Stay: Admitting: Hematology and Oncology

## 2024-08-11 ENCOUNTER — Encounter: Payer: Self-pay | Admitting: Hematology and Oncology

## 2024-08-11 ENCOUNTER — Inpatient Hospital Stay

## 2024-08-11 VITALS — BP 149/78 | HR 81 | Temp 98.5°F | Resp 18 | Ht 64.0 in | Wt 125.3 lb

## 2024-08-11 DIAGNOSIS — C50411 Malignant neoplasm of upper-outer quadrant of right female breast: Secondary | ICD-10-CM

## 2024-08-11 DIAGNOSIS — Z1722 Progesterone receptor negative status: Secondary | ICD-10-CM | POA: Diagnosis not present

## 2024-08-11 DIAGNOSIS — Z79811 Long term (current) use of aromatase inhibitors: Secondary | ICD-10-CM | POA: Insufficient documentation

## 2024-08-11 DIAGNOSIS — Z5111 Encounter for antineoplastic chemotherapy: Secondary | ICD-10-CM | POA: Insufficient documentation

## 2024-08-11 DIAGNOSIS — Z170 Estrogen receptor positive status [ER+]: Secondary | ICD-10-CM | POA: Insufficient documentation

## 2024-08-11 DIAGNOSIS — Z1731 Human epidermal growth factor receptor 2 positive status: Secondary | ICD-10-CM | POA: Insufficient documentation

## 2024-08-11 LAB — CMP (CANCER CENTER ONLY)
ALT: 12 U/L (ref 0–44)
AST: 19 U/L (ref 15–41)
Albumin: 3.9 g/dL (ref 3.5–5.0)
Alkaline Phosphatase: 70 U/L (ref 38–126)
Anion gap: 9 (ref 5–15)
BUN: 16 mg/dL (ref 8–23)
CO2: 26 mmol/L (ref 22–32)
Calcium: 9.6 mg/dL (ref 8.9–10.3)
Chloride: 98 mmol/L (ref 98–111)
Creatinine: 0.62 mg/dL (ref 0.44–1.00)
GFR, Estimated: >60 mL/min
Glucose, Bld: 97 mg/dL (ref 70–99)
Potassium: 3.8 mmol/L (ref 3.5–5.1)
Sodium: 133 mmol/L — ABNORMAL LOW (ref 135–145)
Total Bilirubin: 0.4 mg/dL (ref 0.0–1.2)
Total Protein: 6.4 g/dL — ABNORMAL LOW (ref 6.5–8.1)

## 2024-08-11 LAB — CBC WITH DIFFERENTIAL (CANCER CENTER ONLY)
Abs Immature Granulocytes: 0.03 K/uL (ref 0.00–0.07)
Basophils Absolute: 0.1 K/uL (ref 0.0–0.1)
Basophils Relative: 1 %
Eosinophils Absolute: 0.1 K/uL (ref 0.0–0.5)
Eosinophils Relative: 1 %
HCT: 34.4 % — ABNORMAL LOW (ref 36.0–46.0)
Hemoglobin: 11.4 g/dL — ABNORMAL LOW (ref 12.0–15.0)
Immature Granulocytes: 0 %
Lymphocytes Relative: 22 %
Lymphs Abs: 1.5 K/uL (ref 0.7–4.0)
MCH: 27.7 pg (ref 26.0–34.0)
MCHC: 33.1 g/dL (ref 30.0–36.0)
MCV: 83.5 fL (ref 80.0–100.0)
Monocytes Absolute: 0.6 K/uL (ref 0.1–1.0)
Monocytes Relative: 8 %
Neutro Abs: 4.5 K/uL (ref 1.7–7.7)
Neutrophils Relative %: 68 %
Platelet Count: 221 K/uL (ref 150–400)
RBC: 4.12 MIL/uL (ref 3.87–5.11)
RDW: 13.6 % (ref 11.5–15.5)
WBC Count: 6.8 K/uL (ref 4.0–10.5)
nRBC: 0 % (ref 0.0–0.2)

## 2024-08-11 LAB — MAGNESIUM: Magnesium: 1.8 mg/dL (ref 1.7–2.4)

## 2024-08-11 MED ORDER — DIPHENHYDRAMINE HCL 25 MG PO TABS: 50.0000 mg | ORAL_TABLET | Freq: Once | ORAL | Status: DC

## 2024-08-11 MED ORDER — PERTUZ-TRASTUZ-HYALURON-ZZXF 60-60-2000 MG-MG-U/ML CHEMO ~~LOC~~ SOLN
10.0000 mL | Freq: Once | SUBCUTANEOUS | Status: AC
  Administered 2024-08-11: 600 mg via SUBCUTANEOUS
  Filled 2024-08-11: qty 10

## 2024-08-11 MED ORDER — ACETAMINOPHEN 325 MG PO TABS: 650.0000 mg | ORAL_TABLET | Freq: Once | ORAL | Status: DC

## 2024-08-11 NOTE — Patient Instructions (Signed)
Pertuzumab; Trastuzumab; Hyaluronidase Injection What is this medication? PERTUZUMAB; TRASTUZUMAB; HYALURONIDASE (per TOOZ ue mab; tras TOO zoo mab; hye al ur ON i dase) treats breast cancer. Pertuzumab and trastuzumab work by blocking a protein that causes cancer cells to grow and multiply. This helps to slow or stop the spread of cancer cells. Hyaluronidase works by increasing the absorption of other medications in the body to help them work better. It is a combination medication that contains two monoclonal antibodies. This medicine may be used for other purposes; ask your health care provider or pharmacist if you have questions. COMMON BRAND NAME(S): PHESGO What should I tell my care team before I take this medication? They need to know if you have any of these conditions: Heart failure High blood pressure Irregular heartbeat or rhythm Lung disease An unusual or allergic reaction to pertuzumab, trastuzumab, hyaluronidase, other medications, foods, dyes, or preservatives Pregnant or trying to get pregnant Breast-feeding How should I use this medication? This medication is injected under the skin. It is usually given by your care team in a hospital or clinic setting. It may also be given at home by your care team. Talk to your care team about the use of this medication in children. Special care may be needed. Overdosage: If you think you have taken too much of this medicine contact a poison control center or emergency room at once. NOTE: This medicine is only for you. Do not share this medicine with others. What if I miss a dose? Keep appointments for follow-up doses. It is important not to miss your dose. Call your care team if you are unable to keep an appointment. What may interact with this medication? Certain types of chemotherapy, such as daunorubicin, doxorubicin, epirubicin, idarubicin This list may not describe all possible interactions. Give your health care provider a list of all  the medicines, herbs, non-prescription drugs, or dietary supplements you use. Also tell them if you smoke, drink alcohol, or use illegal drugs. Some items may interact with your medicine. What should I watch for while using this medication? Your condition will be monitored carefully while you are receiving this medication. This medication may make you feel generally unwell. This is not uncommon as chemotherapy can affect healthy cells as well as cancer cells. Report any side effects. Continue your course of treatment even though you feel ill unless your care team tells you to stop. This medication may increase your risk of getting an infection. Call your care team for advice if you get a fever, chills, sore throat, or other symptoms of a cold or flu. Do not treat yourself. Try to avoid being around people who are sick. Avoid taking medications that contain aspirin, acetaminophen, ibuprofen, naproxen, or ketoprofen unless instructed by your care team. These medications may hide a fever. Talk to your care team if you may be pregnant. Serious birth defects can occur if you take this medication during pregnancy and for 7 months after the last dose. You will need a negative pregnancy test before starting this medication. Contraception is recommended while taking this medication and for 7 months after the last dose. Your care team can help you find the option that works for you. Do not breastfeed while taking this medication and for 7 months after the last dose. What side effects may I notice from receiving this medication? Side effects that you should report to your care team as soon as possible: Allergic reactions or angioedema--skin rash, itching or hives, swelling of the  face, eyes, lips, tongue, arms, or legs, trouble swallowing or breathing Dry cough, shortness of breath or trouble breathing Heart failure--shortness of breath, swelling of the ankles, feet, or hands, sudden weight gain, unusual weakness  or fatigue Infection--fever, chills, cough, or sore throat Infusion reactions--chest pain, shortness of breath or trouble breathing, feeling faint or lightheaded Side effects that usually do not require medical attention (report these to your care team if they continue or are bothersome): Diarrhea Hair loss Nausea Pain, redness, or irritation at injection site Pain, redness, or swelling with sores inside the mouth or throat Unusual weakness or fatigue This list may not describe all possible side effects. Call your doctor for medical advice about side effects. You may report side effects to FDA at 1-800-FDA-1088. Where should I keep my medication? This medication is given in a hospital or clinic. It will not be stored at home. NOTE: This sheet is a summary. It may not cover all possible information. If you have questions about this medicine, talk to your doctor, pharmacist, or health care provider.  2024 Elsevier/Gold Standard (2021-11-25 00:00:00)

## 2024-08-11 NOTE — Progress Notes (Cosign Needed)
 " [facility] [address] [place],  [place]  [address] [phone]  Clinic Day:  08/11/2024  Referring physician: Pia Kerney SQUIBB, MD   HISTORY OF PRESENT ILLNESS:  [name] is a 84 y.o. 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   As she did not want chemotherapy or a port placed, she has been receiving SQ Phesgo  (trastuzumab/pertuzumab ) every 3 weeks in conjunction with letrozole  for her adjuvant breast cancer management.  She comes in today to be evaluated before heading into her 10th cycle of Phesgo .  Thus far, she has tolerated her Phesgo  shots fairly well.  She claims both letrozole  and anastrozole  pills both caused her rashes, which led to her discontinuing her endocrine therapy.  She was placed on exemestane  25 mg daily for adjuvant hormonal therapy.    She is here today prior to a 10th cycle of Phesgo  and has multiple complaints. She is not sure she wants to continue treatment, but is willing to proceed with her injection today. She states she pain in her hands, back, legs and feet, with swelling and numbness of the feet.  She reports severe fatigue and weakness. She notes an increase in her fatigue, weakness and pain since starting exemestane .  She states she continues to lose weight despite trying to increase her intake. She states her appetite is fairly good, but her diet is restricted due to having complete dental extractions. She is getting her dentures January 29. She reports intermittent abdominal pain, but states her bowels are regular.  She has occasional mild nausea without vomiting. She denies rashes. She denies having any changes in her breasts which concern her for early disease recurrence.   PHYSICAL EXAM:  Blood pressure (!) 149/78, pulse 81, temperature 98.5 F (36.9 C), temperature source Oral, resp. rate 18, height 5' 4 (1.626 m), weight 125 lb 4.8 oz  (56.8 kg), SpO2 98%. Wt Readings from Last 3 Encounters:  08/11/24 125 lb 4.8 oz (56.8 kg)  07/21/24 128 lb 3 oz (58.1 kg)  06/26/24 129 lb (58.5 kg)   Body mass index is 21.51 kg/m. Performance status (ECOG): 1 - Symptomatic but completely ambulatory Physical Exam Vitals and nursing note reviewed.  Constitutional:      General: She is not in acute distress.    Appearance: She is ill-appearing. She is not toxic-appearing (mildly weak appearing).  HENT:     Head: Normocephalic and atraumatic.     Mouth/Throat:     Mouth: Mucous membranes are moist.     Pharynx: Oropharynx is clear. No oropharyngeal exudate or posterior oropharyngeal erythema.  Eyes:     General: No scleral icterus.    Extraocular Movements: Extraocular movements intact.     Conjunctiva/sclera: Conjunctivae normal.     Pupils: Pupils are equal, round, and reactive to light.  Cardiovascular:     Rate and Rhythm: Normal rate and regular rhythm.     Heart sounds: Normal heart sounds. No murmur heard.    No friction rub. No gallop.  Pulmonary:     Effort: Pulmonary effort is normal.     Breath sounds: Normal breath sounds. No wheezing, rhonchi or rales.  Abdominal:     General: There is no distension.     Palpations: Abdomen is soft. There is no hepatomegaly, splenomegaly or mass.     Tenderness: There is no abdominal tenderness.  Musculoskeletal:  General: Normal range of motion.     Cervical back: Normal range of motion and neck supple. No tenderness.     Right lower leg: No edema.     Left lower leg: No edema.  Lymphadenopathy:     Cervical: No cervical adenopathy.     Upper Body:     Right upper body: No supraclavicular or axillary adenopathy.     Left upper body: No supraclavicular or axillary adenopathy.     Lower Body: No right inguinal adenopathy. No left inguinal adenopathy.  Skin:    General: Skin is warm and dry.     Coloration: Skin is not jaundiced.     Findings: No rash.  Neurological:      Mental Status: She is alert and oriented to person, place, and time.     Cranial Nerves: No cranial nerve deficit.  Psychiatric:        Mood and Affect: Mood normal.        Behavior: Behavior normal.        Thought Content: Thought content normal.    LABS:      Latest Ref Rng & Units 08/11/2024    1:03 PM 07/21/2024    1:02 PM 06/26/2024    1:08 PM  CBC  WBC 4.0 - 10.5 K/uL 6.8  6.9  5.4   Hemoglobin 12.0 - 15.0 g/dL 88.5  88.5  88.2   Hematocrit 36.0 - 46.0 % 34.4  34.3  35.6   Platelets 150 - 400 K/uL 221  187  217       Latest Ref Rng & Units 08/11/2024    1:03 PM 07/21/2024    1:02 PM 06/26/2024    1:08 PM  CMP  Glucose 70 - 99 mg/dL 97  896  876   BUN 8 - 23 mg/dL 16  13  8    Creatinine 0.44 - 1.00 mg/dL 9.37  9.41  9.37   Sodium 135 - 145 mmol/L 133  134  136   Potassium 3.5 - 5.1 mmol/L 3.8  3.9  3.4   Chloride 98 - 111 mmol/L 98  99  99   CO2 22 - 32 mmol/L 26  27  27    Calcium 8.9 - 10.3 mg/dL 9.6  9.6  9.4   Total Protein 6.5 - 8.1 g/dL 6.4  6.1  6.5   Total Bilirubin 0.0 - 1.2 mg/dL 0.4  0.4  0.4   Alkaline Phos 38 - 126 U/L 70  69  76   AST 15 - 41 U/L 19  17  16    ALT 0 - 44 U/L 12  8  8      STUDIES:  Her breast cancer pathology revealed the following:  FINAL MICROSCOPIC DIAGNOSIS:  A. LYMPH NODE, RIGHT AXILLARY #1, SENTINEL, EXCISION: - One lymph node negative for malignancy (0/1). - See cancer summary below.  B. LYMPH NODE, RIGHT AXILLARY #2, SENTINEL, EXCISION: - One lymph node negative for malignancy (0/1). - See cancer summary below. - A pancytokeratin stain was examined due areas of marked thermal artifact.  C. 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 - See cancer summary below. - Biopsy site with coil clip. - Unremarkable skin.  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 Histologic Grade (  Nottingham Histologic Score)      Glandular (Acinar)/Tubular Differentiation: 3      Nuclear Pleomorphism: 3      Mitotic Rate: 3      Overall Grade: 3 Tumor Size: Greatest dimension of largest invasive focus: 35 mm Ductal Carcinoma In Situ (DCIS): Not identified Tumor Extent: Not applicable Lymphatic and/or Vascular Invasion: Not identified Treatment Effect in the Breast: No known presurgical therapy  MARGINS Margin Status for Invasive Carcinoma: All margins negative for invasive carcinoma      Distance from closest margin: 11 mm      Specify closest margin: Posterior  Margin Status for DCIS / PLCIS: All margins negative for DCIS / PLCIS      Distance from DCIS to closest margin: 2 mm      Specify closest margin: Superior  REGIONAL LYMPH NODES Regional Lymph Node Status: All regional lymph nodes negative for tumor      Total Number of Lymph Nodes Examined (sentinel and non-sentinel): 2       Number of Sentinel Nodes Examined: 2  DISTANT METASTASIS Distant Site(s) Involved, if applicable: Not applicable  PATHOLOGIC STAGE CLASSIFICATION (pTNM, AJCC 8th Edition): Modified Classification: Not applicable pT Category: pT2      T Suffix: Not applicable pN Category: pN0      N Suffix: (sn) pM Category: Not applicable  ADDENDUM:  PROGNOSTIC INDICATOR RESULTS (REPEAT):   Immunohistochemical and morphometric analysis performed manually.   The tumor cells are POSITIVE for Her2 (3+).   Estrogen Receptor:       POSITIVE, 35%, MOERATE TO STRONG STAINING  Progesterone Receptor:   NEGATIVE (0)  Proliferation Marker Ki-67:   20%    ASSESSMENT & PLAN:  [name] is an 84 y.o. 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 Clinically,  she is struggling. Her main complaint is pain, which has significantly increased since starting exemestane . As mentioned previously, the patient has had problems with two aromatase inhibitors.  Therefore, I will have her hold exemestane  for now.  She will proceed with her 10th cycle of Phesgo  today, which is a subcutaneous injection that contains both Herceptin and Perjeta . Both agents are used to offset the activity/proliferation of HER2 positive breast cancer.  This injection will continue to be given once every 3 weeks to complete 1 full year of anti-HER2 therapy. I will see her back in 3 weeks before she heads into her 11th cycle of Phesgo .  If she continues to do poorly, we will discuss further therapy at that time.  The patient understands all the plans discussed today and is in agreement with them.   Andrez DELENA Foy, PA-C       "

## 2024-08-12 ENCOUNTER — Other Ambulatory Visit: Payer: Self-pay

## 2024-08-29 ENCOUNTER — Telehealth: Payer: Self-pay

## 2024-08-29 ENCOUNTER — Other Ambulatory Visit: Payer: Self-pay | Admitting: Hematology and Oncology

## 2024-08-29 ENCOUNTER — Other Ambulatory Visit: Payer: Self-pay

## 2024-08-29 ENCOUNTER — Inpatient Hospital Stay: Attending: Hematology

## 2024-08-29 DIAGNOSIS — Z170 Estrogen receptor positive status [ER+]: Secondary | ICD-10-CM

## 2024-08-29 DIAGNOSIS — R197 Diarrhea, unspecified: Secondary | ICD-10-CM

## 2024-08-29 LAB — GASTROINTESTINAL PANEL BY PCR, STOOL (REPLACES STOOL CULTURE)

## 2024-08-29 LAB — C DIFFICILE QUICK SCREEN W PCR REFLEX
C Diff antigen: NEGATIVE
C Diff interpretation: NOT DETECTED
C Diff toxin: NEGATIVE

## 2024-08-29 NOTE — Telephone Encounter (Signed)
 "  NURSING SYMPTOM TRIAGE ASSESSMENT & DISPOSITION  Mode of interaction:  Telephone Date of symptom triage interaction:  08/29/24 Time of symptom triage interaction:  [id number]  Cancer diagnosis:  No diagnosis found. Current anti-cancer treatment:  Patient is on Treatment Plan :  Breast Pertuzumab /Trastuzumab/Hyaluronidase (Phesgo ) q21d  Last treatment date:  08/11/2024 Oral chemotherapy:  No  Symptom Triage Protocol:  Diarrhea  History of the problem:  Diarrhea Frequency of stools:  About every 30-45 minutes since bedtime last night  Consistency:  Liquid  Color:  brown  Odor:  yes  Undigested food or fat:  No  Mucous present:  No  Blood present:  No  Number of stools in past 24 hours:  7  Weight loss:  No  Urine output:  Normal voiding  Urine Character:  yellow  Signs of dehydration:  Dry mouth and - however pt reports that she has dry mouth all the time and uses special mouthwash  Precipitating factors:  nothing  Onset:  Last night; but has been off & on for 2 weeks  Duration:  intermittent  Relieving factors:  Nothing- unrelieved with Pepto  Associated symptoms:  Stool incontinence - pt wears adult briefs baseline  Changes in ADLs:  No     Diet History  Food intolerance:  no  Food aversions:  no  Food allergies:  no  Consumption of well water:  No  Ingestion of unpasteurized milk or milk products:  No  Consumption of raw seafood:  No  Recent travel abroad:  No  Exposure to farm animals or animal feces:  No   Triage Nurse Guidance Signs and Symptoms Action  Grossly bloody stool   Signs of severe dehydration  Severe lethargy or weakness  Heart palpitations  Decreased urine output  Sunken eyes  Orthostatic hypotension  Dizziness   Temperature higher than 100.4 F WITH suspect neutropenia   Symptoms of immune-mediated colitis, such as diarrhea accompanied by blood in the stool, severe abdominal pain, or tenderness   Symptoms of bowel perforation, such as diarrhea  associated with severe abdominal pain, fever chills, nausea, and vomiting  Seek emergency care.  Call an ambulance immediately.   Excessive thirst, dry mouth  Temperature higher than 100.4 F WITHOUT suspect neutropenia  Diarrhea for more than five (5) days  More than four to six stools above baseline per day for two days  Swollen or painful abdomen  More than 10 stools per day  Weight loss of more than five (5) pounds since diarrhea began  Continued diarrhea despite antidiarrheal treatment  Decreased turgor; pinched skin does not spring back  Grade 3 or 4 nausea and vomiting  Seek urgent care within 24 hours.   Less than four (4) stools per day  Chronic diarrhea  Other family members with diarrhea  Recent travel to a foreign country  New prescription  Follow homecare instructions.  Notify MD if no improvement.    Provider Consulted  Provider name and credentials: Kelli Mosher,PA-C  Provider instruction: Definitely a side effect of her treatment. Has she tried Imodium? Can she bring in stool specimen today to check for infectious diarrhea before taking Imodium? Thanks    Patient Instruction  Disclaimer:  Patient specific and Elsevier instruction provided verbally and sent through MyChart for active users.    Consider food and fluid modifications: Drink 8-10 eight-ounce glasses of fluids per day (e.g., water, diluted cranberry juice, sports drinks, decaffeinated tea or coffee). Eat foods high in soluble fiber (e.g., bananas,  oatmeal, applesauce, skinned turkey, chicken, rice, toast). Consider foods containing pectin, a natural fiber that decreases diarrhea. These foods include beets, peeled apples, white rice, bananas, baked potatoes without skin, white bread, plain pasta, avocadoes, and asparagus tips. Eat easy-to-digest foods high in protein, calories, and potassium. - Cook all vegetables well. Raw vegetables are difficult to digest. - Eat small, frequent meals. Do not eat large meals. -  Eat foods at room temperature, as hot and cold temperature foods may insti-gate diarrhea. Avoid foods high in insoluble fiber (e.g., raw fruits and vegetables, skins, seeds, legumes). Avoid milk and dairy products, caffeine, alcohol, sucrose, and sorbitol. - Avoid greasy, fatty, spicy, or fried foods and foods containing olestra. - Refrain from taking fiber supplements.  Implement a rectal skin care routine: Clean the perineal area well with mild soap and water or aloe-based baby wipes. Apply barrier ointment (e.g., zinc oxide) for protection. Sitz baths may add comfort.  Examine the rectal area for red, scaly, or broken skin. If this is present, report it to the healthcare provider. Record the frequency, quality, and volume of stools during the course of treatment.  If diarrhea lasts more than 24 hours, notify the healthcare provider.  Also, consult a healthcare provider before taking any over-the-counter antidiarrheal medications. These can be very effective but may not be appropriate for this situation.  If treatment with immunotherapy or targeted therapies has been prescribed, consultation with the healthcare team should begin prior to starting antidiarrheal.  If prescribed, keep track of medications administered--type, amount, and frequency.  Report the Following Problems: Inability to keep fluids down for 24 hours Diarrhea lasting more than 24 hours Dark yellow urine or absence of urine production More than four (4) to six (6) bowel movements above baseline per day for two days in a row Grade 2 or higher nausea and vomiting that accompanies diarrhea Dizziness Rectal bleeding Temperature greater than 100.4 F Swollen or painful abdomen Red, scaly, or broken skin of the rectal area  Teach Back Method used:  Yes   Nurse Triage Priority:  Urgent (obtain medical orders as indicated with instruction for 8-24 hour or sooner follow-up)  Barriers to Care:  None identified  Nurse Triage  Disposition:  Coming in for stool sample, changing to Imodium (rather than Pepto). Pt given written instructions on how to care/treat diarrhea and when to call office or seek emergent help (Elsevier instructions). C diff test was negative.  Protocol Source:  Ruthine HERO., & Eldonna CANDIE Pee.). (2019). Telephone triage for oncology nurses (3rd ed.). Oncology Nursing Society.  "

## 2024-08-29 NOTE — Patient Instructions (Signed)
 Triage Nurse Guidance Signs and Symptoms Action  Grossly bloody stool   Signs of severe dehydration  Severe lethargy or weakness  Heart palpitations  Decreased urine output  Sunken eyes  Orthostatic hypotension  Dizziness   Temperature higher than 100.4 F WITH suspect neutropenia   Symptoms of immune-mediated colitis, such as diarrhea accompanied by blood in the stool, severe abdominal pain, or tenderness   Symptoms of bowel perforation, such as diarrhea associated with severe abdominal pain, fever chills, nausea, and vomiting  Seek emergency care.  Call an ambulance immediately.   Excessive thirst, dry mouth  Temperature higher than 100.4 F WITHOUT suspect neutropenia  Diarrhea for more than five (5) days  More than four to six stools above baseline per day for two days  Swollen or painful abdomen  More than 10 stools per day  Weight loss of more than five (5) pounds since diarrhea began  Continued diarrhea despite antidiarrheal treatment  Decreased turgor; pinched skin does not spring back  Grade 3 or 4 nausea and vomiting  Seek urgent care within 24 hours.   Less than four (4) stools per day  Chronic diarrhea  Other family members with diarrhea  Recent travel to a foreign country  New prescription  Follow homecare instructions.  Notify MD if no improvement.      Patient Instruction  Disclaimer:  Patient specific and Elsevier instruction provided verbally and sent through MyChart for active users.    Consider food and fluid modifications: Drink 8-10 eight-ounce glasses of fluids per day (e.g., water, diluted cranberry juice, sports drinks, decaffeinated tea or coffee). Eat foods high in soluble fiber (e.g., bananas, oatmeal, applesauce, skinned turkey, chicken, rice, toast). Consider foods containing pectin, a natural fiber that decreases diarrhea. These foods include beets, peeled apples, white rice, bananas, baked potatoes without skin, white bread, plain pasta,  avocadoes, and asparagus tips. Eat easy-to-digest foods high in protein, calories, and potassium. - Cook all vegetables well. Raw vegetables are difficult to digest. - Eat small, frequent meals. Do not eat large meals. - Eat foods at room temperature, as hot and cold temperature foods may insti-gate diarrhea. Avoid foods high in insoluble fiber (e.g., raw fruits and vegetables, skins, seeds, legumes). Avoid milk and dairy products, caffeine, alcohol, sucrose, and sorbitol. - Avoid greasy, fatty, spicy, or fried foods and foods containing olestra. - Refrain from taking fiber supplements.  Implement a rectal skin care routine: Clean the perineal area well with mild soap and water or aloe-based baby wipes. Apply barrier ointment (e.g., zinc oxide) for protection. Sitz baths may add comfort.  Examine the rectal area for red, scaly, or broken skin. If this is present, report it to the healthcare provider. Record the frequency, quality, and volume of stools during the course of treatment.  If diarrhea lasts more than 24 hours, notify the healthcare provider.  Also, consult a healthcare provider before taking any over-the-counter antidiarrheal medications. These can be very effective but may not be appropriate for this situation.  If treatment with immunotherapy or targeted therapies has been prescribed, consultation with the healthcare team should begin prior to starting antidiarrheal.  If prescribed, keep track of medications administered--type, amount, and frequency.  Report the Following Problems: Inability to keep fluids down for 24 hours Diarrhea lasting more than 24 hours Dark yellow urine or absence of urine production More than four (4) to six (6) bowel movements above baseline per day for two days in a row Grade 2 or higher nausea and vomiting  that accompanies diarrhea Dizziness Rectal bleeding Temperature greater than 100.4 F Swollen or painful abdomen Red, scaly, or broken skin of the  rectal area  Teach Back Method used:  Yes

## 2024-09-01 ENCOUNTER — Inpatient Hospital Stay: Admitting: Oncology

## 2024-09-01 ENCOUNTER — Inpatient Hospital Stay
